# Patient Record
Sex: Female | Born: 1955 | Race: White | Hispanic: No | Marital: Married | Smoking: Current every day smoker
Health system: Southern US, Community
[De-identification: ages and names within clinical notes are randomized; demographics above are authoritative.]

## PROBLEM LIST (undated history)

## (undated) DIAGNOSIS — K219 Gastro-esophageal reflux disease without esophagitis: Secondary | ICD-10-CM

## (undated) DIAGNOSIS — F419 Anxiety disorder, unspecified: Secondary | ICD-10-CM

## (undated) HISTORY — PX: ABDOMINAL HYSTERECTOMY: SHX81

## (undated) HISTORY — DX: Anxiety disorder, unspecified: F41.9

## (undated) HISTORY — PX: DILATION AND CURETTAGE OF UTERUS: SHX78

## (undated) HISTORY — PX: TONSILLECTOMY: SUR1361

---

## 1976-06-06 HISTORY — PX: CERVICAL CONE BIOPSY: SUR198

## 2005-12-11 ENCOUNTER — Emergency Department (HOSPITAL_COMMUNITY): Admission: EM | Admit: 2005-12-11 | Discharge: 2005-12-11 | Payer: Self-pay | Admitting: Emergency Medicine

## 2012-12-12 ENCOUNTER — Other Ambulatory Visit: Payer: Self-pay | Admitting: Obstetrics and Gynecology

## 2012-12-12 DIAGNOSIS — N83202 Unspecified ovarian cyst, left side: Secondary | ICD-10-CM

## 2012-12-18 ENCOUNTER — Ambulatory Visit
Admission: RE | Admit: 2012-12-18 | Discharge: 2012-12-18 | Disposition: A | Discharge status: Home / Self Care | Payer: BC Managed Care – PPO | Source: Ambulatory Visit | Attending: Obstetrics and Gynecology | Admitting: Obstetrics and Gynecology

## 2012-12-18 DIAGNOSIS — N83202 Unspecified ovarian cyst, left side: Secondary | ICD-10-CM

## 2012-12-18 MED ORDER — IOHEXOL 300 MG/ML  SOLN
100.0000 mL | Freq: Once | INTRAMUSCULAR | Status: AC | PRN
  Administered 2012-12-18: 100 mL via INTRAVENOUS

## 2013-01-03 ENCOUNTER — Other Ambulatory Visit: Payer: Self-pay | Admitting: Gastroenterology

## 2013-01-03 DIAGNOSIS — R933 Abnormal findings on diagnostic imaging of other parts of digestive tract: Secondary | ICD-10-CM

## 2013-01-04 ENCOUNTER — Other Ambulatory Visit: Payer: BC Managed Care – PPO

## 2013-01-09 ENCOUNTER — Other Ambulatory Visit: Payer: Self-pay | Admitting: Gastroenterology

## 2013-01-09 DIAGNOSIS — R933 Abnormal findings on diagnostic imaging of other parts of digestive tract: Secondary | ICD-10-CM

## 2013-01-10 ENCOUNTER — Ambulatory Visit
Admission: RE | Admit: 2013-01-10 | Discharge: 2013-01-10 | Disposition: A | Discharge status: Home / Self Care | Payer: BC Managed Care – PPO | Source: Ambulatory Visit | Attending: Gastroenterology | Admitting: Gastroenterology

## 2013-01-10 DIAGNOSIS — R933 Abnormal findings on diagnostic imaging of other parts of digestive tract: Secondary | ICD-10-CM

## 2013-01-16 ENCOUNTER — Telehealth (INDEPENDENT_AMBULATORY_CARE_PROVIDER_SITE_OTHER): Payer: Self-pay

## 2013-01-16 NOTE — Telephone Encounter (Signed)
Pt returned my call . I r/s her appt from 8/25 to 9/19 with Dr Maisie Fus per Dr Doreen Salvage request.

## 2013-01-16 NOTE — Telephone Encounter (Signed)
LMOM for pt to call me b/c I need to r/s her appt from Dr Dwain Sarna to Dr Maisie Fus. Dr Dwain Sarna reviewed her records and advised pt to be seen by Dr Maisie Fus.

## 2013-01-29 ENCOUNTER — Ambulatory Visit (INDEPENDENT_AMBULATORY_CARE_PROVIDER_SITE_OTHER): Payer: Self-pay | Admitting: General Surgery

## 2013-02-06 ENCOUNTER — Encounter (INDEPENDENT_AMBULATORY_CARE_PROVIDER_SITE_OTHER): Payer: Self-pay | Admitting: General Surgery

## 2013-02-06 ENCOUNTER — Ambulatory Visit (INDEPENDENT_AMBULATORY_CARE_PROVIDER_SITE_OTHER): Payer: BC Managed Care – PPO | Admitting: General Surgery

## 2013-02-06 VITALS — BP 122/78 | HR 74 | Resp 16 | Ht 62.0 in | Wt 135.4 lb

## 2013-02-06 DIAGNOSIS — N739 Female pelvic inflammatory disease, unspecified: Secondary | ICD-10-CM

## 2013-02-06 DIAGNOSIS — N731 Chronic parametritis and pelvic cellulitis: Secondary | ICD-10-CM

## 2013-02-06 NOTE — Patient Instructions (Signed)
Diverticular Disease  Diverticulosis of the colon is a common condition that afflicts about 50 percent of Americans by age 57 and nearly all by age 2. Only a small percentage of those with diverticulosis have symptoms, and even fewer will ever require surgery.    What is Diverticulosis/ Diverticulitis?  Diverticula are pockets that develop in the colon wall, usually in the sigmoid or left colon, but may involve the entire colon. Diverticulosis describes the presence of these pockets. Diverticulitis describes inflammation or complications of these pockets.  What are the symptoms of diverticular disease?  Uncomplicated diverticular disease is usually not associated with symptoms. Symptoms are related to complications of diverticular disease including diverticulits and bleeding. Diverticular disease is a common cause of significant bleeding from the colon.  Diverticulitis - an infection of the diverticula - may cause one or more of the following symptoms: pain in the abdomen, chills, fever and change in bowel habits. More intense symptoms are associated with serious complications such as perforation (rupture), abscess or fistula formation (an abnormal connection between the colon and another organ or the skin). What is the cause of diverticular disease?  The cause of diverticulosis and diverticulitis is not precisely known, but it is more common for people with a low fiber diet. It is thought that a low-fiber diet over the years creates increased colon pressure and results in pockets or diverticula.  How is diverticular disease treated?  Increasing the amount of dietary fiber (grains, legumes, vegetables, etc.) - and sometimes restricting certain foods reduces the pressure in the colon and may decrease the risk of complications due to diverticular disease.  Diverticulitis requires different management. Mild cases may be managed with oral antibiotics, dietary restrictions and possibly stool softeners.  More severe cases require hospitalization with intravenous antibiotics and dietary restraints. Most acute attacks can be relieved with such methods.  When is surgery necessary?  Surgery is reserved for patients with recurrent episodes of diverticulitis, complications or severe attacks when there's little or no response to medication. Surgery may also be required in individuals with a single episode of severe bleeding from diverticulosis or with recurrent episodes of bleeding.  Surgical treatment for diverticulitis removes the diseased part of the colon, most commonly, the left or sigmoid colon. Often the colon is hooked up or "anastomosed" again to the rectum. Complete recovery can be expected. Normal bowel function usually resumes in about three weeks. In emergency surgeries, patients may require a temporary colostomy bag. Patients are encouraged to seek medical attention for abdominal symptoms early to help avoid complications.  What is a colon and rectal surgeon? Colon and rectal surgeons are experts in the surgical and non-surgical treatment of diseases of the colon, rectum and anus. They have completed advanced surgical training in the treatment of these diseases as well as full general surgical training. Board-certified colon and rectal surgeons complete residencies in general surgery and colon and rectal surgery, and pass intensive examinations conducted by the American Board of Surgery and the American Board of Colon and Rectal Surgery. They are well-versed in the treatment of both benign and malignant diseases of the colon, rectum and anus and are able to perform routine screening examinations and surgically treat conditions if indicated to do so.  2012 American Society of Colon & Rectal Surgeons

## 2013-02-06 NOTE — Progress Notes (Signed)
Chief Complaint  Patient presents with  . New Evaluation    eval abnormal CT scan    HISTORY: Jill Manning is a 57 y.o. 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  This all began in June when she developed severe sharp left lower quadrant pain. This radiated to her pelvis. After that she began to encounter vaginal discharge. On June 18 she experienced a large amount of purulent vaginal discharge. She was evaluated by her primary care doctor and placed on doxycycline. She is given a referral to gynecology with Dr. Cherly Hensen. She was placed on Premarin cream at that time. A CT scan was performed which showed an ovarian cyst and some stranding around her sigmoid colon with concern for a abscess. She underwent a colonoscopy with Dr. Loreta Ave. This was essentially normal except for some diverticulosis. She did notice that her colonic bowel prep seemed to be coming through her vagina as well.  She was placed on Cipro and Flagyl. She was able to take Flagyl for approximately 2 weeks but stopped this 2 to side effects. She is continuing on the Cipro currently for another 2 weeks. She does experience occasional vaginal drainage especially when she has diarrhea. She denies any bleeding rectally or vaginally. She does have ongoing lower back pain that appears to be unrelated to bowel movements. She has regular bowel movements without constipation except for on occasion. She currently is eating a high-fiber diet.    Past Medical History  Diagnosis Date  . Anxiety       Past Surgical History  Procedure Laterality Date  . Dilation and curettage of uterus    . Abdominal hysterectomy    . Cervical cone biopsy  1978      Current Outpatient Prescriptions  Medication Sig Dispense Refill  . ALPRAZolam (XANAX) 0.25 MG tablet       . ciprofloxacin (CIPRO) 500 MG tablet       . Hydrocodone-Acetaminophen 5-300 MG TABS       . nystatin-triamcinolone (MYCOLOG II) cream       .  ondansetron (ZOFRAN) 8 MG tablet       . PREMARIN vaginal cream       . metroNIDAZOLE (FLAGYL) 250 MG tablet        No current facility-administered medications for this visit.      Allergies  Allergen Reactions  . Flagyl [Metronidazole] Nausea Only      History reviewed. No pertinent family history.    History   Social History  . Marital Status: Married    Spouse Name: N/A    Number of Children: N/A  . Years of Education: N/A   Social History Main Topics  . Smoking status: Current Every Day Smoker -- 0.25 packs/day  . Smokeless tobacco: Never Used  . Alcohol Use: Yes  . Drug Use: No  . Sexual Activity: None   Other Topics Concern  . None   Social History Narrative  . None       REVIEW OF SYSTEMS - PERTINENT POSITIVES ONLY: Review of Systems - General ROS: negative for - chills, fatigue or fever Respiratory ROS: no cough, shortness of breath, or wheezing Cardiovascular ROS: no chest pain or dyspnea on exertion Gastrointestinal ROS: no abdominal pain, change in bowel habits, or black or bloody stools Genito-Urinary ROS: no dysuria, trouble voiding, or hematuria  EXAM: Filed Vitals:   02/06/13 1341  BP: 122/78  Pulse: 74  Resp: 16  Gen:  No acute distress.  Well nourished and well groomed.   Neurological: Alert and oriented to person, place, and time. Coordination normal.  Head: Normocephalic and atraumatic.  Eyes: Conjunctivae are normal. Pupils are equal, round, and reactive to light. No scleral icterus.  Neck: Normal range of motion. Neck supple. No tracheal deviation or thyromegaly present.  No cervical lymphadenopathy. Cardiovascular: Normal rate, regular rhythm, normal heart sounds  Respiratory: Effort normal.  No respiratory distress. No chest wall tenderness. Breath sounds normal.  No wheezes, rales or rhonchi.  GI: Soft. Bowel sounds are normal. The abdomen is soft and nontender.  There is no rebound and no guarding.  Musculoskeletal: Normal range  of motion. Extremities are nontender.  Skin: Skin is warm and dry. No rash noted. No diaphoresis. No erythema. No pallor. No clubbing, cyanosis, or edema.   Psychiatric: Normal mood and affect. Behavior is normal. Judgment and thought content normal.     LABORATORY RESULTS: Available labs are reviewed    RADIOLOGY RESULTS:   Images and reports are reviewed. CT ABD IMPRESSION:  Sigmoid diverticulosis with mild pericolonic fluid, possibly  reflecting sigmoid diverticulitis.  Adjacent 3.3 cm gas and fluid collection, suspicious for  pericolonic abscess or fistula.  Collection abuts the anterior vagina and posterior bladder dome,  but fistulous communication with either structure is not confirmed.  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 This is most likely the result of diverticulitis. I would like her to continue her Cipro for another 2 weeks. At that point we will get a CT scan. If she has a remaining abscess we will place a drain in interventional radiology. If there is no abscess and inflammation appears to be coming down, I recommend to proceed with sigmoidectomy approximately 3 months from the beginning of her treatment. This would be in mid November. In talking to the patient it sounds like Dr. Cherly Hensen would like to remove both of her ovaries. I think this could easily be performed at the same time. I will see her back in mid October to begin operative planning. I have recommended that she stay on a high fiber diet with plenty of fluids. She should try MiraLax for her occasional constipation. She should try to wean herself off of her narcotics, as well, to help promote good bowel motility.  She will try switching to Ibuprofen for help with her back pain.   Vanita Panda, MD Colon and Rectal Surgery / General Surgery [facility]      Visit Diagnoses: 1. Pelvic abscess in female     Primary Care Physician: No primary provider on file.

## 2013-02-07 ENCOUNTER — Telehealth (INDEPENDENT_AMBULATORY_CARE_PROVIDER_SITE_OTHER): Payer: Self-pay | Admitting: General Surgery

## 2013-02-07 NOTE — Telephone Encounter (Signed)
LMOM asking pt to return my call.  This is because I need to inform her that we have set her up for her CT scan at [facility] located at [address] on 02/12/13 at 2:45.  I need to explain that she cannot eat any solid foods 4 hours before the test and that she will drink her first bottle of contrast at 1:00 and the second bottle at 2:00.

## 2013-02-07 NOTE — Telephone Encounter (Signed)
Spoke with pt and was informed that the test needed to be done in 2 weeks. I rescheduled the appt for 02/25/13 with all the other directions the same including times.

## 2013-02-08 ENCOUNTER — Encounter (INDEPENDENT_AMBULATORY_CARE_PROVIDER_SITE_OTHER): Payer: Self-pay

## 2013-02-08 ENCOUNTER — Telehealth (INDEPENDENT_AMBULATORY_CARE_PROVIDER_SITE_OTHER): Payer: Self-pay | Admitting: General Surgery

## 2013-02-08 NOTE — Telephone Encounter (Signed)
Jill Manning called back and stated she had talked directly with pt just now, who stated she is controlling the pain with OTC meds and wants to "keep on the program" as discussed with Dr. Maisie Fus.  Her husband had called yesterday for the narcotic refill, but she does not think she needs it now.

## 2013-02-08 NOTE — Telephone Encounter (Signed)
Received call from [place] Ana, at [facility] with Dr. Cherly Hensen, regarding phone call from pt's husband to ask for additional pain medicine.  Dr. Cherly Hensen and Dr. Maisie Fus are coordinating surgery for a fistula.  Dr. Maisie Fus had recommended weaning off the narcotics, so Dr. Nelta Numbers office reluctant to refill Norco without checking with Dr. Maisie Fus first.  She cannot take NSAIDs (diverticulitis,) and is in a lot of pain.  Please advise.

## 2013-02-08 NOTE — Telephone Encounter (Signed)
Yes, I do not want to prescribe narcotics.  Her husband seems to be the only one asking for them.

## 2013-02-12 ENCOUNTER — Other Ambulatory Visit: Payer: BC Managed Care – PPO

## 2013-02-22 ENCOUNTER — Ambulatory Visit (INDEPENDENT_AMBULATORY_CARE_PROVIDER_SITE_OTHER): Payer: Self-pay | Admitting: General Surgery

## 2013-02-25 ENCOUNTER — Ambulatory Visit
Admission: RE | Admit: 2013-02-25 | Discharge: 2013-02-25 | Disposition: A | Discharge status: Home / Self Care | Payer: BC Managed Care – PPO | Source: Ambulatory Visit | Attending: General Surgery | Admitting: General Surgery

## 2013-02-25 DIAGNOSIS — N739 Female pelvic inflammatory disease, unspecified: Secondary | ICD-10-CM

## 2013-02-25 MED ORDER — IOHEXOL 300 MG/ML  SOLN
100.0000 mL | Freq: Once | INTRAMUSCULAR | Status: AC | PRN
  Administered 2013-02-25: 100 mL via INTRAVENOUS

## 2013-02-26 ENCOUNTER — Telehealth (INDEPENDENT_AMBULATORY_CARE_PROVIDER_SITE_OTHER): Payer: Self-pay

## 2013-02-26 NOTE — Telephone Encounter (Signed)
Message copied by Ivory Broad on Tue Feb 26, 2013  9:32 AM ------      Message from: Carson, Broadus John      Created: Mon Feb 25, 2013  4:52 PM      Regarding: CT results       At your convenience, please let pt know that her abscess has resolved and she appears to be healing.  I will see her back in mid oct.            AT      ----- Message -----         From: Rad Results In Interface         Sent: 02/25/2013   3:36 PM           To: Romie Levee, MD                   ------

## 2013-02-26 NOTE — Telephone Encounter (Signed)
Left message for pt to call and get her ct results below.  She has a follow up in October.

## 2013-02-26 NOTE — Telephone Encounter (Signed)
Patient return call to office.  Message given to patient that CT results show abscess has resolved and appears to be healing.  Patient advised to continue with follow up appointment in October.  Patient verbalized understanding and agrees with plan as noted.

## 2013-03-01 ENCOUNTER — Encounter (INDEPENDENT_AMBULATORY_CARE_PROVIDER_SITE_OTHER): Payer: Self-pay

## 2013-03-05 ENCOUNTER — Encounter (INDEPENDENT_AMBULATORY_CARE_PROVIDER_SITE_OTHER): Payer: Self-pay

## 2013-03-20 ENCOUNTER — Ambulatory Visit (INDEPENDENT_AMBULATORY_CARE_PROVIDER_SITE_OTHER): Payer: BC Managed Care – PPO | Admitting: General Surgery

## 2013-03-20 ENCOUNTER — Encounter (INDEPENDENT_AMBULATORY_CARE_PROVIDER_SITE_OTHER): Payer: Self-pay | Admitting: General Surgery

## 2013-03-20 VITALS — BP 142/88 | HR 74 | Temp 97.4°F | Resp 18 | Ht 62.0 in | Wt 136.6 lb

## 2013-03-20 DIAGNOSIS — K5732 Diverticulitis of large intestine without perforation or abscess without bleeding: Secondary | ICD-10-CM

## 2013-03-20 MED ORDER — METRONIDAZOLE 500 MG PO TABS: 500.0000 mg | ORAL_TABLET | ORAL | Status: AC

## 2013-03-20 NOTE — Progress Notes (Addendum)
Jill Manning is a 57 y.o. female who is here for a follow up visit regarding her diverticulitis and colovaginal fistula.  Her vaginal drainage has stopped.  She is feeling better.  She is eating a high fiber diet and having no issues with her bowel habits.   Previous history: Jill Manning is a 57 y.o. 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 This all began in June when she developed severe sharp left lower quadrant pain. This radiated to her pelvis. After that she began to encounter vaginal discharge. On June 18 she experienced a large amount of purulent vaginal discharge. She was evaluated by her primary care doctor and placed on doxycycline. She was given a referral to gynecology with Dr. Cherly Hensen. She was placed on Premarin cream at that time. A CT scan was performed which showed an ovarian cyst and some stranding around her sigmoid colon with concern for a abscess. She underwent a colonoscopy with Dr. Loreta Ave. This was essentially normal except for some diverticulosis. She did notice that her colonic bowel prep seemed to be coming through her vagina as well. She was placed on Cipro and Flagyl. She was able to take Flagyl for approximately 2 weeks but stopped this due to side effects. She continued on just Cipro for another 2 weeks. She does experience occasional vaginal drainage, especially when she has diarrhea. She denies any bleeding rectally or vaginally. She does have ongoing lower back pain that appears to be unrelated to bowel movements.    Objective: Filed Vitals:   03/20/13 1408  BP: 142/88  Pulse: 74  Temp: 97.4 F (36.3 C)  Resp: 18    General appearance: alert and cooperative GI: soft, non-tender; bowel sounds normal; no masses,  no organomegaly   Assessment and Plan: Jill Manning is a 57 y.o. F with complicated diverticulitis with abscess that appears to have drained vaginally.  She is doing better now.  She also has a dermoid cyst on  her ovary that the patient tells me Dr Cherly Hensen would like to remove.  I think it would be reasonable to do this together, if she is agreeable to this as well.  I hope to schedule this in mid to late Nov.   The surgery and anatomy were described to the patient as well as the risks of surgery and the possible complications.  These include: Bleeding, infection and possible wound complications such as hernia, damage to adjacent structures, leak of surgical connections, which can lead to other surgeries and possibly an ostomy (5-7%), possible need for other procedures, such as abscess drains in radiology, possible prolonged hospital stay, possible diarrhea from removal of part of the colon, possible constipation from narcotics, prolonged fatigue/weakness or appetite loss, possible complications of their medical problems such as heart disease or arrhythmias or lung problems, death (less than 1%). I believe the patient understands and wishes to proceed with the surgery.     Vanita Panda, MD [facility], [place] [phone]

## 2013-03-20 NOTE — Patient Instructions (Signed)
CENTRAL  SURGERY  ONE-DAY (1) PRE-OP HOME COLON PREP INSTRUCTIONS: ** MIRALAX / GATORADE PREP / FLAGYL**  You must follow the instructions below carefully.  If you have questions or problems, please call and speak to someone in the clinic department at our office:   [phone].     INSTRUCTIONS: 1. Five days prior to your procedure do not eat nuts, popcorn, or fruit with seeds.  Stop all fiber supplements such as Metamucil, Citrucel, etc. 2. Two days before surgery fill the prescription at a pharmacy of your choice and purchase the additional supplies below.         MIRALAX - GATORADE -- DULCOLAX TABS -- FLEET ENEMA:   Purchase a bottle of MIRALAX  (255 gm bottle)    In addition, purchase four (4) DULCOLAX TABLETS (no prescription required- ask the pharmacist if you can't find them)   Purchase one Fleet Enema (Green and white box)    Purchase one 64 oz GATORADE.  (Do NOT purchase red Gatorade; any other flavor is acceptable) and place in refrigerator to get cold.  3.   Day Before Surgery:   6 am: Wash you abdomen with soap and repeat this on the morning of surgery and take 4 Dulcolax tablets   You may only have clear liquids (tea, coffee, juice, broth, jello, soft drinks, gummy bears).  You cannot have solid foods, cream, milk or milk products.  Drink at lease 8 ounces of liquids every hour while awake.   Take the Flagyl prescription as directed at 8 am, 2 pm and 8 pm.  It is helpful to take this with some jello instead of on an empty stomach.  Any flavor is ok, except red jello, which will cause red stools.   Mix the entire bottle of MiraLax and the Gatorade in a large container.    10:00am: Begin drinking the Gatorade mixture until gone (8 oz every 15-30 minutes).      You may suck on a lime wedge or hard candy to "freshen your palate" in between glasses   If you are a diabetic, take your blood sugar reading several time throughout the prep.  Have some juice available to take if your  sugar level gets too low   You may feel chilled while taking the prep.  Have some warm tea or broth to help warm up.   Continue clear liquids until midnight or bedtime  3. The day of your procedure:   Do not eat or drink ANYTHING after midnight before your surgery.     If you take Heart or Blood Pressure medicine, ask the pre-op nurses about these during your preop appointment.   Take a regular Fleet Enema one hour before leaving the come to the hospital.  Try to hold it in 5-10 mins before expelling.   Further pre-operative instructions will be given to you from the hospital.   Expect to be contacted 5-7 days before your surgery.     

## 2013-06-11 ENCOUNTER — Other Ambulatory Visit: Payer: Self-pay | Admitting: Obstetrics and Gynecology

## 2013-06-14 ENCOUNTER — Encounter (HOSPITAL_COMMUNITY): Payer: Self-pay | Admitting: Pharmacy Technician

## 2013-06-19 ENCOUNTER — Encounter (HOSPITAL_COMMUNITY)
Admission: RE | Admit: 2013-06-19 | Discharge: 2013-06-19 | Disposition: A | Discharge status: Home / Self Care | Payer: BC Managed Care – PPO | Source: Ambulatory Visit | Attending: General Surgery | Admitting: General Surgery

## 2013-06-19 ENCOUNTER — Encounter (HOSPITAL_COMMUNITY): Payer: Self-pay

## 2013-06-19 DIAGNOSIS — Z01818 Encounter for other preprocedural examination: Secondary | ICD-10-CM | POA: Insufficient documentation

## 2013-06-19 DIAGNOSIS — Z01812 Encounter for preprocedural laboratory examination: Secondary | ICD-10-CM | POA: Insufficient documentation

## 2013-06-19 HISTORY — DX: Gastro-esophageal reflux disease without esophagitis: K21.9

## 2013-06-19 LAB — ABO/RH: ABO/RH(D): A POS

## 2013-06-19 LAB — CBC
HEMATOCRIT: 41.5 % (ref 36.0–46.0)
Hemoglobin: 14.1 g/dL (ref 12.0–15.0)
MCH: 31.8 pg (ref 26.0–34.0)
MCHC: 34 g/dL (ref 30.0–36.0)
MCV: 93.7 fL (ref 78.0–100.0)
PLATELETS: 351 10*3/uL (ref 150–400)
RBC: 4.43 MIL/uL (ref 3.87–5.11)
RDW: 13.2 % (ref 11.5–15.5)
WBC: 9.8 10*3/uL (ref 4.0–10.5)

## 2013-06-19 NOTE — Progress Notes (Signed)
[id number]  Dr. Marcello Moores No order entry in [facility] now -was there on 06-18-13, can you do MD order entry for your procedure please. Pt. Here now in PAT appointment. Have orders for Dr. Garwin Brothers procedure.

## 2013-06-19 NOTE — Progress Notes (Signed)
06-19-13 1530 Dr. Marcello Moores your MD orders dropped from Epic due to 90 day window outdated at midnight 06-18-13, no MD orders available for your procedure now. Need re-entry please. Thanks. W. Floy Sabina

## 2013-06-19 NOTE — Patient Instructions (Addendum)
[place]  06/19/2013   Your procedure is scheduled on:  06-25-2013  Report to [facility] at   [place]     AM   Call this number if you have problems the morning of surgery: [phone]  Or Presurgical Testing [phone](Tyla Burgner) For Living Will and/or Health Care Power Attorney Forms: please provide copy for your medical record,may bring AM of surgery(Forms should be already notarized -we do not provide this service).  Remember: Follow any bowel prep instructions per MD office.    Do not eat food:After Midnight.     Take these medicines the morning of surgery with A SIP OF WATER: Alprazolam. Ondansetron.   Do not wear jewelry, make-up or nail polish.  Do not wear lotions, powders, or perfumes. You may wear deodorant.  Do not shave 12 hours prior to first CHG shower(legs and under arms).(face and neck okay.)  Do not bring valuables to the hospital.(Hospital is not responsible for lost valuables).  Contacts, dentures or removable bridgework, body piercing, hair pins may not be worn into surgery.  Leave suitcase in the car. After surgery it may be brought to your room.  For patients admitted to the hospital, checkout time is 11:00 AM the day of discharge.(Restricted visitors-Persons, age 38 or younger - may not visit at this time.)    Patients discharged the day of surgery will not be allowed to drive home. Must have responsible person with you x 24 hours once discharged.  Name and phone number of your driver: Peterson Ao -spouse [phone] cell  Special Instructions: CHG(Chlorhedine 4%-"Hibiclens","Betasept","Aplicare") Shower Use Special Wash: see special instructions.(avoid face and genitals)   Please read over the following fact sheets that you were given:  Blood Transfusion fact sheet.  Remember : Type/Screen "Blue armbands" - may not be removed once applied(would result in being retested AM of surgery, if removed).  Failure to follow these instructions may  result in Cancellation of your surgery.   Patient signature_______________________________________________________

## 2013-06-20 ENCOUNTER — Other Ambulatory Visit (INDEPENDENT_AMBULATORY_CARE_PROVIDER_SITE_OTHER): Payer: Self-pay | Admitting: General Surgery

## 2013-06-24 MED ORDER — DEXTROSE 5 % IV SOLN
2.0000 g | INTRAVENOUS | Status: AC
  Administered 2013-06-25: 2 g via INTRAVENOUS
  Filled 2013-06-24: qty 2

## 2013-06-25 ENCOUNTER — Inpatient Hospital Stay (HOSPITAL_COMMUNITY)
Admission: RE | Admit: 2013-06-25 | Discharge: 2013-06-28 | DRG: 330 | Disposition: A | Discharge status: Home / Self Care | Payer: BC Managed Care – PPO | Source: Ambulatory Visit | Attending: General Surgery | Admitting: General Surgery

## 2013-06-25 ENCOUNTER — Encounter (HOSPITAL_COMMUNITY): Payer: Self-pay | Admitting: *Deleted

## 2013-06-25 ENCOUNTER — Encounter (HOSPITAL_COMMUNITY): Payer: BC Managed Care – PPO

## 2013-06-25 ENCOUNTER — Inpatient Hospital Stay (HOSPITAL_COMMUNITY): Payer: BC Managed Care – PPO | Admitting: Certified Registered Nurse Anesthetist

## 2013-06-25 ENCOUNTER — Encounter (HOSPITAL_COMMUNITY)
Admission: RE | Disposition: A | Discharge status: Home / Self Care | Payer: Self-pay | Source: Ambulatory Visit | Attending: General Surgery

## 2013-06-25 DIAGNOSIS — F411 Generalized anxiety disorder: Secondary | ICD-10-CM | POA: Diagnosis present

## 2013-06-25 DIAGNOSIS — F17200 Nicotine dependence, unspecified, uncomplicated: Secondary | ICD-10-CM | POA: Diagnosis present

## 2013-06-25 DIAGNOSIS — K5732 Diverticulitis of large intestine without perforation or abscess without bleeding: Principal | ICD-10-CM | POA: Diagnosis present

## 2013-06-25 DIAGNOSIS — K5669 Other intestinal obstruction: Secondary | ICD-10-CM | POA: Diagnosis present

## 2013-06-25 DIAGNOSIS — N824 Other female intestinal-genital tract fistulae: Secondary | ICD-10-CM | POA: Diagnosis present

## 2013-06-25 DIAGNOSIS — Z01812 Encounter for preprocedural laboratory examination: Secondary | ICD-10-CM

## 2013-06-25 DIAGNOSIS — D279 Benign neoplasm of unspecified ovary: Secondary | ICD-10-CM | POA: Diagnosis present

## 2013-06-25 DIAGNOSIS — K219 Gastro-esophageal reflux disease without esophagitis: Secondary | ICD-10-CM | POA: Diagnosis present

## 2013-06-25 HISTORY — PX: ROBOTIC ASSISTED BILATERAL SALPINGO OOPHERECTOMY: SHX6078

## 2013-06-25 LAB — TYPE AND SCREEN
ABO/RH(D): A POS
ANTIBODY SCREEN: NEGATIVE

## 2013-06-25 SURGERY — ROBOT ASSISTED LAPAROSCOPIC PARTIAL COLECTOMY
Anesthesia: General | Site: Abdomen

## 2013-06-25 MED ORDER — HYDROMORPHONE HCL PF 1 MG/ML IJ SOLN
INTRAMUSCULAR | Status: DC | PRN
  Administered 2013-06-25 (×4): 0.5 mg via INTRAVENOUS

## 2013-06-25 MED ORDER — ONDANSETRON HCL 4 MG/2ML IJ SOLN
INTRAMUSCULAR | Status: DC | PRN
  Administered 2013-06-25: 4 mg via INTRAVENOUS

## 2013-06-25 MED ORDER — ENOXAPARIN SODIUM 40 MG/0.4ML ~~LOC~~ SOLN
40.0000 mg | SUBCUTANEOUS | Status: DC
  Administered 2013-06-26 – 2013-06-28 (×3): 40 mg via SUBCUTANEOUS
  Filled 2013-06-25 (×5): qty 0.4

## 2013-06-25 MED ORDER — PROMETHAZINE HCL 25 MG/ML IJ SOLN
6.2500 mg | INTRAMUSCULAR | Status: DC | PRN
  Administered 2013-06-25: 12.5 mg via INTRAVENOUS

## 2013-06-25 MED ORDER — KETOROLAC TROMETHAMINE 15 MG/ML IJ SOLN: 15.0000 mg | Freq: Four times a day (QID) | INTRAMUSCULAR | Status: DC | PRN

## 2013-06-25 MED ORDER — ONDANSETRON HCL 4 MG/2ML IJ SOLN
INTRAMUSCULAR | Status: AC
  Filled 2013-06-25: qty 2

## 2013-06-25 MED ORDER — SCOPOLAMINE 1 MG/3DAYS TD PT72
MEDICATED_PATCH | TRANSDERMAL | Status: AC
  Filled 2013-06-25: qty 1

## 2013-06-25 MED ORDER — NEOSTIGMINE METHYLSULFATE 1 MG/ML IJ SOLN
INTRAMUSCULAR | Status: AC
  Filled 2013-06-25: qty 10

## 2013-06-25 MED ORDER — EPHEDRINE SULFATE 50 MG/ML IJ SOLN
INTRAMUSCULAR | Status: DC | PRN
  Administered 2013-06-25: 5 mg via INTRAVENOUS
  Administered 2013-06-25: 10 mg via INTRAVENOUS
  Administered 2013-06-25: 5 mg via INTRAVENOUS

## 2013-06-25 MED ORDER — ACETAMINOPHEN 500 MG PO TABS
1000.0000 mg | ORAL_TABLET | Freq: Four times a day (QID) | ORAL | Status: AC
  Administered 2013-06-25 – 2013-06-26 (×4): 1000 mg via ORAL
  Filled 2013-06-25 (×4): qty 2

## 2013-06-25 MED ORDER — ROCURONIUM BROMIDE 100 MG/10ML IV SOLN
INTRAVENOUS | Status: AC
  Filled 2013-06-25: qty 1

## 2013-06-25 MED ORDER — LACTATED RINGERS IV SOLN
INTRAVENOUS | Status: DC | PRN
  Administered 2013-06-25 (×4): via INTRAVENOUS

## 2013-06-25 MED ORDER — ROCURONIUM BROMIDE 100 MG/10ML IV SOLN
INTRAVENOUS | Status: DC | PRN
  Administered 2013-06-25: 50 mg via INTRAVENOUS
  Administered 2013-06-25: 20 mg via INTRAVENOUS
  Administered 2013-06-25 (×2): 10 mg via INTRAVENOUS
  Administered 2013-06-25 (×3): 20 mg via INTRAVENOUS
  Administered 2013-06-25: 10 mg via INTRAVENOUS

## 2013-06-25 MED ORDER — MIDAZOLAM HCL 5 MG/5ML IJ SOLN
INTRAMUSCULAR | Status: DC | PRN
  Administered 2013-06-25: 2 mg via INTRAVENOUS

## 2013-06-25 MED ORDER — LIDOCAINE HCL (CARDIAC) 20 MG/ML IV SOLN
INTRAVENOUS | Status: AC
  Filled 2013-06-25: qty 5

## 2013-06-25 MED ORDER — GLYCOPYRROLATE 0.2 MG/ML IJ SOLN
INTRAMUSCULAR | Status: DC | PRN
  Administered 2013-06-25: 0.6 mg via INTRAVENOUS

## 2013-06-25 MED ORDER — FENTANYL CITRATE 0.05 MG/ML IJ SOLN
INTRAMUSCULAR | Status: AC
  Filled 2013-06-25: qty 5

## 2013-06-25 MED ORDER — HYDROMORPHONE HCL PF 1 MG/ML IJ SOLN
INTRAMUSCULAR | Status: AC
  Filled 2013-06-25: qty 1

## 2013-06-25 MED ORDER — BUPIVACAINE-EPINEPHRINE PF 0.25-1:200000 % IJ SOLN
INTRAMUSCULAR | Status: AC
  Filled 2013-06-25: qty 30

## 2013-06-25 MED ORDER — GLYCOPYRROLATE 0.2 MG/ML IJ SOLN
INTRAMUSCULAR | Status: AC
  Filled 2013-06-25: qty 3

## 2013-06-25 MED ORDER — NEOSTIGMINE METHYLSULFATE 1 MG/ML IJ SOLN
INTRAMUSCULAR | Status: DC | PRN
  Administered 2013-06-25: 5 mg via INTRAVENOUS

## 2013-06-25 MED ORDER — STERILE WATER FOR IRRIGATION IR SOLN
Status: DC | PRN
  Administered 2013-06-25: 3000 mL

## 2013-06-25 MED ORDER — FENTANYL CITRATE 0.05 MG/ML IJ SOLN
INTRAMUSCULAR | Status: DC | PRN
  Administered 2013-06-25: 100 ug via INTRAVENOUS
  Administered 2013-06-25 (×2): 50 ug via INTRAVENOUS
  Administered 2013-06-25: 100 ug via INTRAVENOUS
  Administered 2013-06-25: 50 ug via INTRAVENOUS
  Administered 2013-06-25: 100 ug via INTRAVENOUS
  Administered 2013-06-25: 50 ug via INTRAVENOUS

## 2013-06-25 MED ORDER — LACTATED RINGERS IR SOLN
Status: DC | PRN
  Administered 2013-06-25: 1000 mL

## 2013-06-25 MED ORDER — LACTATED RINGERS IV SOLN
INTRAVENOUS | Status: DC | PRN
  Administered 2013-06-25: 08:00:00 via INTRAVENOUS

## 2013-06-25 MED ORDER — 0.9 % SODIUM CHLORIDE (POUR BTL) OPTIME
TOPICAL | Status: DC | PRN
  Administered 2013-06-25: 2000 mL

## 2013-06-25 MED ORDER — ALVIMOPAN 12 MG PO CAPS
12.0000 mg | ORAL_CAPSULE | Freq: Once | ORAL | Status: AC
  Administered 2013-06-25: 12 mg via ORAL
  Filled 2013-06-25: qty 1

## 2013-06-25 MED ORDER — BUPIVACAINE-EPINEPHRINE 0.25% -1:200000 IJ SOLN
INTRAMUSCULAR | Status: DC | PRN
  Administered 2013-06-25: 20 mL

## 2013-06-25 MED ORDER — HYDROMORPHONE HCL PF 1 MG/ML IJ SOLN: 0.2500 mg | INTRAMUSCULAR | Status: DC | PRN

## 2013-06-25 MED ORDER — DIPHENHYDRAMINE HCL 50 MG/ML IJ SOLN: 12.5000 mg | Freq: Four times a day (QID) | INTRAMUSCULAR | Status: DC | PRN

## 2013-06-25 MED ORDER — HETASTARCH-ELECTROLYTES 6 % IV SOLN
INTRAVENOUS | Status: DC | PRN
  Administered 2013-06-25: 10:00:00 via INTRAVENOUS

## 2013-06-25 MED ORDER — SODIUM CHLORIDE 0.9 % IJ SOLN
INTRAMUSCULAR | Status: AC
  Filled 2013-06-25: qty 10

## 2013-06-25 MED ORDER — ONDANSETRON HCL 4 MG/2ML IJ SOLN
4.0000 mg | Freq: Four times a day (QID) | INTRAMUSCULAR | Status: DC | PRN
  Administered 2013-06-25 – 2013-06-26 (×4): 4 mg via INTRAVENOUS
  Filled 2013-06-25 (×4): qty 2

## 2013-06-25 MED ORDER — PROMETHAZINE HCL 25 MG/ML IJ SOLN
INTRAMUSCULAR | Status: AC
  Filled 2013-06-25: qty 1

## 2013-06-25 MED ORDER — DEXAMETHASONE SODIUM PHOSPHATE 10 MG/ML IJ SOLN
INTRAMUSCULAR | Status: AC
Start: 2013-06-25 — End: 2013-06-25
  Filled 2013-06-25: qty 1

## 2013-06-25 MED ORDER — ALPRAZOLAM 0.25 MG PO TABS: 0.2500 mg | ORAL_TABLET | Freq: Two times a day (BID) | ORAL | Status: DC | PRN

## 2013-06-25 MED ORDER — ALVIMOPAN 12 MG PO CAPS
12.0000 mg | ORAL_CAPSULE | Freq: Two times a day (BID) | ORAL | Status: DC
  Administered 2013-06-26 (×2): 12 mg via ORAL
  Filled 2013-06-25 (×4): qty 1

## 2013-06-25 MED ORDER — PROPOFOL 10 MG/ML IV BOLUS
INTRAVENOUS | Status: AC
  Filled 2013-06-25: qty 20

## 2013-06-25 MED ORDER — PHENYLEPHRINE 40 MCG/ML (10ML) SYRINGE FOR IV PUSH (FOR BLOOD PRESSURE SUPPORT)
PREFILLED_SYRINGE | INTRAVENOUS | Status: AC
  Filled 2013-06-25: qty 10

## 2013-06-25 MED ORDER — SCOPOLAMINE 1 MG/3DAYS TD PT72
MEDICATED_PATCH | TRANSDERMAL | Status: DC | PRN
  Administered 2013-06-25: 1 via TRANSDERMAL

## 2013-06-25 MED ORDER — SUCCINYLCHOLINE CHLORIDE 20 MG/ML IJ SOLN
INTRAMUSCULAR | Status: DC | PRN
  Administered 2013-06-25: 100 mg via INTRAVENOUS

## 2013-06-25 MED ORDER — PROPOFOL 10 MG/ML IV BOLUS
INTRAVENOUS | Status: DC | PRN
  Administered 2013-06-25: 150 mg via INTRAVENOUS

## 2013-06-25 MED ORDER — KETOROLAC TROMETHAMINE 15 MG/ML IJ SOLN
15.0000 mg | Freq: Four times a day (QID) | INTRAMUSCULAR | Status: DC
  Administered 2013-06-25 – 2013-06-27 (×7): 15 mg via INTRAVENOUS
  Filled 2013-06-25 (×11): qty 1

## 2013-06-25 MED ORDER — SUCCINYLCHOLINE CHLORIDE 20 MG/ML IJ SOLN
INTRAMUSCULAR | Status: AC
  Filled 2013-06-25: qty 1

## 2013-06-25 MED ORDER — DEXTROSE 5 % IV SOLN
2.0000 g | Freq: Two times a day (BID) | INTRAVENOUS | Status: AC
  Administered 2013-06-25: 2 g via INTRAVENOUS
  Filled 2013-06-25 (×2): qty 2

## 2013-06-25 MED ORDER — ALUM & MAG HYDROXIDE-SIMETH 200-200-20 MG/5ML PO SUSP: 30.0000 mL | Freq: Four times a day (QID) | ORAL | Status: DC | PRN

## 2013-06-25 MED ORDER — MORPHINE SULFATE 2 MG/ML IJ SOLN
2.0000 mg | INTRAMUSCULAR | Status: DC | PRN
  Administered 2013-06-25 – 2013-06-26 (×4): 2 mg via INTRAVENOUS
  Filled 2013-06-25 (×4): qty 1

## 2013-06-25 MED ORDER — MIDAZOLAM HCL 2 MG/2ML IJ SOLN
INTRAMUSCULAR | Status: AC
  Filled 2013-06-25: qty 2

## 2013-06-25 MED ORDER — KCL IN DEXTROSE-NACL 20-5-0.45 MEQ/L-%-% IV SOLN
INTRAVENOUS | Status: DC
  Administered 2013-06-25 – 2013-06-26 (×2): via INTRAVENOUS
  Filled 2013-06-25 (×3): qty 1000

## 2013-06-25 MED ORDER — HYDROMORPHONE HCL PF 2 MG/ML IJ SOLN
INTRAMUSCULAR | Status: AC
  Filled 2013-06-25: qty 1

## 2013-06-25 MED ORDER — DEXAMETHASONE SODIUM PHOSPHATE 10 MG/ML IJ SOLN
INTRAMUSCULAR | Status: DC | PRN
  Administered 2013-06-25: 10 mg via INTRAVENOUS

## 2013-06-25 MED ORDER — PHENYLEPHRINE HCL 10 MG/ML IJ SOLN
INTRAMUSCULAR | Status: DC | PRN
  Administered 2013-06-25 (×5): 80 ug via INTRAVENOUS

## 2013-06-25 MED ORDER — HEPARIN SODIUM (PORCINE) 5000 UNIT/ML IJ SOLN
5000.0000 [IU] | Freq: Once | INTRAMUSCULAR | Status: AC
  Administered 2013-06-25: 5000 [IU] via SUBCUTANEOUS
  Filled 2013-06-25: qty 1

## 2013-06-25 MED ORDER — EPHEDRINE SULFATE 50 MG/ML IJ SOLN
INTRAMUSCULAR | Status: AC
  Filled 2013-06-25: qty 1

## 2013-06-25 MED ORDER — LACTATED RINGERS IV SOLN: INTRAVENOUS | Status: DC

## 2013-06-25 MED ORDER — ONDANSETRON HCL 4 MG PO TABS
4.0000 mg | ORAL_TABLET | Freq: Four times a day (QID) | ORAL | Status: DC | PRN
  Administered 2013-06-27 – 2013-06-28 (×3): 4 mg via ORAL
  Filled 2013-06-25 (×3): qty 1

## 2013-06-25 MED ORDER — CEFOTETAN DISODIUM-DEXTROSE 2-2.08 GM-% IV SOLR
INTRAVENOUS | Status: AC
  Filled 2013-06-25: qty 50

## 2013-06-25 MED ORDER — LIDOCAINE HCL (CARDIAC) 20 MG/ML IV SOLN
INTRAVENOUS | Status: DC | PRN
  Administered 2013-06-25: 100 mg via INTRAVENOUS

## 2013-06-25 MED ORDER — DIPHENHYDRAMINE HCL 12.5 MG/5ML PO ELIX: 12.5000 mg | ORAL_SOLUTION | Freq: Four times a day (QID) | ORAL | Status: DC | PRN

## 2013-06-25 SURGICAL SUPPLY — 100 items
APPLICATOR DUAL LIQUID (MISCELLANEOUS) ×3 IMPLANT
APPLIER CLIP 5 13 M/L LIGAMAX5 (MISCELLANEOUS)
APPLIER CLIP ROT 10 11.4 M/L (STAPLE)
BLADE EXTENDED COATED 6.5IN (ELECTRODE) ×3 IMPLANT
BLADE HEX COATED 2.75 (ELECTRODE) ×6 IMPLANT
BLADE SURG SZ10 CARB STEEL (BLADE) ×3 IMPLANT
CABLE HIGH FREQUENCY MONO STRZ (ELECTRODE) ×3 IMPLANT
CANISTER SUCTION 2500CC (MISCELLANEOUS) ×3 IMPLANT
CATH KIT ON Q 7.5IN SLV (PAIN MANAGEMENT) IMPLANT
CELLS DAT CNTRL 66122 CELL SVR (MISCELLANEOUS) ×1 IMPLANT
CLIP APPLIE 5 13 M/L LIGAMAX5 (MISCELLANEOUS) IMPLANT
CLIP APPLIE ROT 10 11.4 M/L (STAPLE) IMPLANT
CLIP LIGATING HEM O LOK PURPLE (MISCELLANEOUS) ×6 IMPLANT
CLIP LIGATING HEMO O LOK GREEN (MISCELLANEOUS) ×3 IMPLANT
CLIP LIGATING HEMOLOK MED (MISCELLANEOUS) IMPLANT
CORDS BIPOLAR (ELECTRODE) ×3 IMPLANT
COUNTER NEEDLE 20 DBL MAG RED (NEEDLE) ×3 IMPLANT
COVER MAYO STAND STRL (DRAPES) ×6 IMPLANT
COVER SURGICAL LIGHT HANDLE (MISCELLANEOUS) ×6 IMPLANT
COVER TIP SHEARS 8 DVNC (MISCELLANEOUS) ×1 IMPLANT
COVER TIP SHEARS 8MM DA VINCI (MISCELLANEOUS) ×2
DECANTER SPIKE VIAL GLASS SM (MISCELLANEOUS) IMPLANT
DRAIN CHANNEL 19F RND (DRAIN) IMPLANT
DRAPE LAPAROSCOPIC ABDOMINAL (DRAPES) ×3 IMPLANT
DRAPE LG THREE QUARTER DISP (DRAPES) ×15 IMPLANT
DRAPE TABLE BACK 44X90 PK DISP (DRAPES) ×6 IMPLANT
DRAPE UTILITY XL STRL (DRAPES) ×3 IMPLANT
DRAPE WARM FLUID 44X44 (DRAPE) ×6 IMPLANT
DRSG OPSITE POSTOP 4X10 (GAUZE/BANDAGES/DRESSINGS) IMPLANT
DRSG OPSITE POSTOP 4X6 (GAUZE/BANDAGES/DRESSINGS) ×3 IMPLANT
DRSG OPSITE POSTOP 4X8 (GAUZE/BANDAGES/DRESSINGS) IMPLANT
DRSG TEGADERM 2-3/8X2-3/4 SM (GAUZE/BANDAGES/DRESSINGS) IMPLANT
DRSG TEGADERM 4X4.75 (GAUZE/BANDAGES/DRESSINGS) IMPLANT
ELECT REM PT RETURN 9FT ADLT (ELECTROSURGICAL) ×3
ELECTRODE REM PT RTRN 9FT ADLT (ELECTROSURGICAL) ×1 IMPLANT
ENDOLOOP SUT PDS II  0 18 (SUTURE)
ENDOLOOP SUT PDS II 0 18 (SUTURE) IMPLANT
GLOVE BIO SURGEON STRL SZ 6.5 (GLOVE) ×6 IMPLANT
GLOVE BIO SURGEONS STRL SZ 6.5 (GLOVE) ×3
GLOVE BIOGEL PI IND STRL 7.0 (GLOVE) ×6 IMPLANT
GLOVE BIOGEL PI INDICATOR 7.0 (GLOVE) ×12
GLOVE ECLIPSE 6.5 STRL STRAW (GLOVE) ×6 IMPLANT
GLOVE ECLIPSE 7.0 STRL STRAW (GLOVE) ×18 IMPLANT
GLOVE ECLIPSE 8.0 STRL XLNG CF (GLOVE) ×12 IMPLANT
GLOVE INDICATOR 8.0 STRL GRN (GLOVE) ×6 IMPLANT
GOWN PREVENTION PLUS LG XLONG (DISPOSABLE) ×6 IMPLANT
GOWN STRL REUS W/TWL 2XL LVL3 (GOWN DISPOSABLE) ×6 IMPLANT
GOWN STRL REUS W/TWL XL LVL3 (GOWN DISPOSABLE) ×12 IMPLANT
KIT ACCESSORY DA VINCI DISP (KITS) ×2
KIT ACCESSORY DVNC DISP (KITS) ×1 IMPLANT
KIT BASIN OR (CUSTOM PROCEDURE TRAY) ×6 IMPLANT
KIT PROCEDURE DA VINCI SI (MISCELLANEOUS)
KIT PROCEDURE DVNC SI (MISCELLANEOUS) IMPLANT
LEGGING LITHOTOMY PAIR STRL (DRAPES) ×3 IMPLANT
PENCIL BUTTON HOLSTER BLD 10FT (ELECTRODE) ×6 IMPLANT
PUMP PAIN ON-Q (MISCELLANEOUS) IMPLANT
RTRCTR WOUND ALEXIS 18CM MED (MISCELLANEOUS) ×3
SCISSORS LAP 5X35 DISP (ENDOMECHANICALS) IMPLANT
SEALER TISSUE G2 STRG ARTC 35C (ENDOMECHANICALS) IMPLANT
SET IRRIG TUBING LAPAROSCOPIC (IRRIGATION / IRRIGATOR) ×3 IMPLANT
SLEEVE XCEL OPT CAN 5 100 (ENDOMECHANICALS) IMPLANT
SOLUTION ANTI FOG 6CC (MISCELLANEOUS) ×3 IMPLANT
SOLUTION ELECTROLUBE (MISCELLANEOUS) ×3 IMPLANT
SPONGE GAUZE 4X4 12PLY (GAUZE/BANDAGES/DRESSINGS) IMPLANT
SPONGE LAP 18X18 X RAY DECT (DISPOSABLE) ×6 IMPLANT
STAPLER CUT CVD 40MM BLUE (STAPLE) ×3 IMPLANT
STAPLER CUT RELOAD BLUE (STAPLE) ×3 IMPLANT
STAPLER VISISTAT 35W (STAPLE) IMPLANT
SUCTION POOLE TIP (SUCTIONS) ×3 IMPLANT
SUT MNCRL AB 4-0 PS2 18 (SUTURE) ×3 IMPLANT
SUT PDS AB 1 CTX 36 (SUTURE) IMPLANT
SUT PDS AB 1 TP1 96 (SUTURE) ×6 IMPLANT
SUT PROLENE 0 CT 2 (SUTURE) IMPLANT
SUT SILK 2 0 (SUTURE) ×2
SUT SILK 2 0 SH CR/8 (SUTURE) ×6 IMPLANT
SUT SILK 2-0 18XBRD TIE 12 (SUTURE) ×1 IMPLANT
SUT SILK 3 0 (SUTURE) ×2
SUT SILK 3 0 SH CR/8 (SUTURE) ×6 IMPLANT
SUT SILK 3-0 18XBRD TIE 12 (SUTURE) ×1 IMPLANT
SUT VIC AB 0 CT1 36 (SUTURE) ×9 IMPLANT
SUT VIC AB 2-0 SH 27 (SUTURE) ×2
SUT VIC AB 2-0 SH 27X BRD (SUTURE) ×1 IMPLANT
SUT VIC AB 4-0 PS2 27 (SUTURE) ×6 IMPLANT
SUT VICRYL 0 TIES 12 18 (SUTURE) ×3 IMPLANT
SUT VICRYL 0 UR6 27IN ABS (SUTURE) ×3 IMPLANT
SYR BULB IRRIGATION 50ML (SYRINGE) ×3 IMPLANT
SYS LAPSCP GELPORT 120MM (MISCELLANEOUS)
SYSTEM LAPSCP GELPORT 120MM (MISCELLANEOUS) IMPLANT
TAPE UMBILICAL COTTON 1/8X30 (MISCELLANEOUS) IMPLANT
TOWEL OR 17X26 10 PK STRL BLUE (TOWEL DISPOSABLE) ×6 IMPLANT
TOWEL OR NON WOVEN STRL DISP B (DISPOSABLE) ×6 IMPLANT
TRAY FOLEY CATH 14FRSI W/METER (CATHETERS) ×3 IMPLANT
TRAY LAP CHOLE (CUSTOM PROCEDURE TRAY) ×3 IMPLANT
TROCAR 12M 150ML BLUNT (TROCAR) ×3 IMPLANT
TROCAR BLADELESS OPT 5 100 (ENDOMECHANICALS) ×3 IMPLANT
TUBING CONNECTING 10 (TUBING) IMPLANT
TUBING CONNECTING 10' (TUBING)
TUBING FILTER THERMOFLATOR (ELECTROSURGICAL) ×3 IMPLANT
TUNNELER SHEATH ON-Q 16GX12 DP (PAIN MANAGEMENT) IMPLANT
YANKAUER SUCT BULB TIP 10FT TU (MISCELLANEOUS) ×6 IMPLANT

## 2013-06-25 NOTE — Transfer of Care (Signed)
Immediate Anesthesia Transfer of Care Note  Patient: Jill Manning  Procedure(s) Performed: Procedure(s) (LRB): ROBOTIC  ASSISTED SIGMOID COLECTOMY (N/A) OPEN BILATERAL SALPINGO OOPHORECTOMY (N/A)  Patient Location: PACU  Anesthesia Type: General  Level of Consciousness: sedated, patient cooperative and responds to stimulation  Airway & Oxygen Therapy: Patient Spontanous Breathing and Patient connected to face mask oxgen  Post-op Assessment: Report given to PACU RN and Post -op Vital signs reviewed and stable  Post vital signs: Reviewed and stable  Complications: No apparent anesthesia complications

## 2013-06-25 NOTE — H&P (Signed)
HPI: Jill Manning is a 58 y.o. femaleregarding her diverticulitis and colovaginal fistula. Her vaginal drainage has stopped. She is feeling better. She is eating a high fiber diet and having no issues with her bowel habits.  Previous history: Jill Manning is a 58 y.o. 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 This all began in June when she developed severe sharp left lower quadrant pain. This radiated to her pelvis. After that she began to encounter vaginal discharge. On June 18 she experienced a large amount of purulent vaginal discharge. She was evaluated by her primary care doctor and placed on doxycycline. She was given a referral to gynecology with Dr. Garwin Brothers. She was placed on Premarin cream at that time. A CT scan was performed which showed an ovarian cyst and some stranding around her sigmoid colon with concern for a abscess. She underwent a colonoscopy with Dr. Collene Mares. This was essentially normal except for some diverticulosis. She did notice that her colonic bowel prep seemed to be coming through her vagina as well. She was placed on Cipro and Flagyl. She was able to take Flagyl for approximately 2 weeks but stopped this due to side effects. She continued on just Cipro for another 2 weeks. She does experience occasional vaginal drainage, especially when she has diarrhea. She denies any bleeding rectally or vaginally. She does have ongoing lower back pain that appears to be unrelated to bowel movements.   Past Medical History  Diagnosis Date  . Anxiety   . GERD (gastroesophageal reflux disease)     occ. controls with Tums and diet   Past Surgical History  Procedure Laterality Date  . Dilation and curettage of uterus    . Cervical cone biopsy  1978  . Tonsillectomy      child  . Abdominal hysterectomy      "dysplasia"   History   Social History  . Marital Status: Married    Spouse Name: N/A    Number of Children: N/A  . Years of Education:  N/A   Occupational History  . Not on file.   Social History Main Topics  . Smoking status: Current Every Day Smoker -- 0.25 packs/day for 35 years  . Smokeless tobacco: Never Used     Comment: being more thoughtful, not smoking as much  . Alcohol Use: Yes     Comment: occ.  . Drug Use: No  . Sexual Activity: Yes   Other Topics Concern  . Not on file   Social History Narrative  . No narrative on file   History reviewed. No pertinent family history.   Medication List           ALPRAZolam 0.25 MG tablet  Commonly known as:  XANAX  Take 0.25 mg by mouth 2 (two) times daily as needed for anxiety.     calcium carbonate 500 MG chewable tablet  Commonly known as:  TUMS - dosed in mg elemental calcium  Chew 1 tablet by mouth daily.     ondansetron 8 MG tablet  Commonly known as:  ZOFRAN  Take by mouth every 8 (eight) hours as needed for nausea or vomiting. Nausea as needed     PREMARIN vaginal cream  Generic drug:  conjugated estrogens  Place 1 Applicatorful vaginally 2 (two) times a week.       Allergies  Allergen Reactions  . Flagyl [Metronidazole] Nausea Only    Objective:  Filed Vitals:   06/25/13 0240  BP: 116/80  Pulse:   Temp:   Resp:    General appearance: alert and cooperative  CV: RRR Lungs: CTA GI: soft, non-tender; bowel sounds normal; no masses, no organomegaly   Assessment and Plan:  Jill Manning is a 58 y.o. F with complicated diverticulitis with abscess that appears to have drained vaginally. She is doing better now. She also has a dermoid cyst on her ovary that Dr Garwin Brothers would like to remove. I think it iseasonable to do this together. The surgery and anatomy were described to the patient as well as the risks of surgery and the possible complications.  We discussed using robotic and laparoscopic assistance as needed.  Risks include: Bleeding, infection and possible wound complications such as hernia, damage to adjacent structures, leak of  surgical connections, which can lead to other surgeries and possibly an ostomy (5-7%), possible need for other procedures, such as abscess drains in radiology, possible prolonged hospital stay, possible diarrhea from removal of part of the colon, possible constipation from narcotics, prolonged fatigue/weakness or appetite loss, possible complications of their medical problems such as heart disease or arrhythmias or lung problems, death (less than 1%).  I believe the patient understands and wishes to proceed with the surgery.

## 2013-06-25 NOTE — Anesthesia Preprocedure Evaluation (Addendum)
Anesthesia Evaluation  Patient identified by MRN, date of birth, ID band Patient awake    Reviewed: Allergy & Precautions, H&P , NPO status , Patient's Chart, lab work & pertinent test results  Airway Mallampati: II TM Distance: >3 FB Neck ROM: Full    Dental  (+) Dental Advisory Given   Pulmonary Current Smoker,          Cardiovascular negative cardio ROS  Rhythm:Regular Rate:Normal     Neuro/Psych PSYCHIATRIC DISORDERS Anxiety negative neurological ROS     GI/Hepatic Neg liver ROS, GERD-  Medicated,  Endo/Other  negative endocrine ROS  Renal/GU negative Renal ROS     Musculoskeletal negative musculoskeletal ROS (+)   Abdominal   Peds  Hematology negative hematology ROS (+)   Anesthesia Other Findings   Reproductive/Obstetrics negative OB ROS                          Anesthesia Physical Anesthesia Plan  ASA: III  Anesthesia Plan: General   Post-op Pain Management:    Induction: Intravenous  Airway Management Planned: Oral ETT  Additional Equipment:   Intra-op Plan:   Post-operative Plan: Extubation in OR and Possible Post-op intubation/ventilation  Informed Consent: I have reviewed the patients History and Physical, chart, labs and discussed the procedure including the risks, benefits and alternatives for the proposed anesthesia with the patient or authorized representative who has indicated his/her understanding and acceptance.   Dental advisory given  Plan Discussed with: CRNA  Anesthesia Plan Comments:        Anesthesia Quick Evaluation

## 2013-06-25 NOTE — Preoperative (Signed)
Beta Blockers   Reason not to administer Beta Blockers:Not Applicable 

## 2013-06-25 NOTE — Op Note (Signed)
06/25/2013  2:32 PM  PATIENT:  Marchelle Gearing  58 y.o. female  Patient Care Team: Chesley Noon, MD as PCP - General (Family Medicine) Vicenta Aly as Nurse Practitioner (Nurse Practitioner)  PRE-OPERATIVE DIAGNOSIS:  diverticulitis/LEFT DERMOID CYST   POST-OPERATIVE DIAGNOSIS:  diverticulitis/LEFT DERMOID CYST   PROCEDURE:   ROBOTIC  ASSISTED SIGMOID COLECTOMY OPEN BILATERAL SALPINGO OOPHORECTOMY  SURGEON:  Surgeon(s): Adin Hector, MD Leighton Ruff, MD Marvene Staff, MD  ASSISTANT: Johney Maine   ANESTHESIA:   general  EBL:  Total I/O In: 7026 [I.V.:3000; IV Piggyback:500] Out: 225 [Urine:150; Blood:75]  DRAINS: none   SPECIMEN:  Source of Specimen:  recto-sigmoid colon  DISPOSITION OF SPECIMEN:  PATHOLOGY  COUNTS:  YES  PLAN OF CARE: Admit to inpatient   PATIENT DISPOSITION:  PACU - hemodynamically stable.  INDICATION: This is a 58 year old female who presented to me back in the summer with diverticulitis and a colovaginal fistula. She is also being followed by Dr. Garwin Brothers for a left dermoid cyst. We are here today to resect her sigmoid colon and remove her diverticular disease as well as the dermoid cyst.   OR FINDINGS: Relatively short segment of severe diverticular disease with stricture and colovaginal fistula, left dermoid cyst  DESCRIPTION: the patient was identified in the preoperative holding area and taken to the OR where they were laid Supine on the operating room table.  Gen. anesthesia was induced without difficulty. A Foley catheter was inserted under sterile conditions. The patient was placed in lithotomy position and appropriately padded and secured to the robotic table.  SCDs were also noted to be in place prior to the initiation of anesthesia.  The patient was then prepped and draped in the usual sterile fashion.    A surgical timeout was performed indicating the correct patient, procedure, positioning and need for preoperative  antibiotics.   I began by making a incision over the right lateral rectus muscle using a 15 blade scalpel. This was done above the umbilicus. I then bluntly dissected the subcutaneous tissues to the level of the fascia. The fascia was then transected using a Bovie electrocautery. The muscle fibers were then split and the peritoneum was elevated using Kelly clamps. The peritoneum was entered using Metzenbaum scissors. A stay suture was placed and a Hasson port was placed into the abdomen. The abdomen was insufflated to approximately 15 mm of mercury.  A robotic camera was placed into the abdomen. There were no signs of injury upon entry. An 8 mm trocar was placed in the left upper cautery under direct visualization. A second trocar was placed in the left lateral position.  A third robotic trocar was placed in the right lower quadrant also under direct visualization. A 5 mm assist port was placed in the right upper quadrant. After this was completed the ports were appropriately separated. The patient was then placed in Trendelenburg position and tilted slightly to the right. The small bowel was mobilized out of the pelvis as well as the omentum.  I then docked the robot over the patient's left hip.  All ports were secured. The camera was then placed in the abdomen. I then switched over to the robotic portion. I mobilized the mesentery of the sigmoid colon. I dissected out medial to lateral. Hemostasis was achieved with electric artery as I went. I identified the left ureter in its standard position.  The right ureter was easily visualized as well. I then dissected out the sigmoid artery and hemorrhoidal artery.  Clips were placed on both robotically. I then transected both arteries. The remaining mesentery was divided using electrocautery. I then mobilized the colon off of the lateral sidewall using a combination of blunt and sharp dissection. After this was completed we're able to mobilize the colon out of the  pelvis. I continued my dissection laterally on both sides down to the level of the rectosigmoid junction. I identified the rectosigmoid junction and made a division in the mesentery using electrocautery. Hemostasis was achieved as we went. Once all the mesentery was divided and the colon was properly mobilized, I turned my attention to the left sidewall. The white line of Toldt was mobilized proximally halfway up the abdomen. This helped the sigmoid to be more medial. I could easily get the sigmoid to reach down into the pelvis for an anastomosis and did not need to mobilize the splenic flexure. We then removed the robot and made a 6 cm Pfannenstiel incision using a scalpel. Dissection was carried down through subcutaneous tissues using blunt dissection and Bovie electric cautery. The fascia was incised in a horizontal manner using Bovie electrocautery. We then elevated the fascia from the muscle layer he and transected the peritoneum and midline using Bovie electric artery. After this was completed an alexis would protector was placed. The colon was mobilized out of the wound.  I transected the proximal colon using a contour stapling device. I then transected the rectosigmoid junction also using the contour stapling device with a blue load staples.  The specimen was then removed from the abdomen and sent to pathology for further examination. I then removed the staple line from the proximal colon using Bovie electric cautery. A pursestring device was used to create a pursestring suture using 2-0 Prolene. A 29 mm EEA anvil was then placed into the proximal colon and the purse stringer was tied tightly around this. After this was completed a 29 mm EEA stapler was placed through the rectum and out the rectal stump. The anastomosis was created without difficulty. The 2 doughnuts were intact. The rectum was insufflated and no air leaks were identified under water. The anastomosis was approximately 16 cm. The colon was  then desufflated and we turned the case over to the GYN team for removal of ovaries. This will be dictated separately. After they completed their procedure the pelvis was irrigated. A portion of the staple line was oversewn using 3-0 silk sutures. The omentum was brought down into the pelvis. The Periumbilical fascia of the camera port was closed using a 0 Vicryl suture.  I then closed the peritoneum of the Pfannenstiel incision using a running 2-0 Vicryl suture. The fascia was then closed using 2, 0 loop PDS sutures.  The subcutaneous tissue was reapproximated using 2-0 Vicryl sutures. The skin of all incisions was closed using 4-0 Vicryl running sutures.  Dermabond was placed on the port sites and a sterile dressing was placed on the Pfannenstiel incision. The patient was then awakened from anesthesia and sent to the post anesthesia care unit in stable condition. All counts were correct per operating room staff.

## 2013-06-25 NOTE — Brief Op Note (Signed)
06/25/2013  1:42 PM  PATIENT:  Jill Manning  58 y.o. female  PRE-OPERATIVE DIAGNOSIS:  diverticulitis/LEFT DERMOID CYST   POST-OPERATIVE DIAGNOSIS:  diverticulitis/LEFT DERMOID CYST   PROCEDURE:  Procedure(s): ROBOTIC  ASSISTED SIGMOID COLECTOMY (N/A) OPEN BILATERAL SALPINGO OOPHORECTOMY (N/A)  SURGEON:  Surgeon(s) and Role: Panel 1:    * Adin Hector, MD - Assisting    * Leighton Ruff, MD - Primary  Panel 2:    * Marvene Staff, MD - Primary  PHYSICIAN ASSISTANT:   ASSISTANTS: Laury Deep, CNM   ANESTHESIA:   general  EBL:  Total I/O In: 2500 [I.V.:2000; IV Piggyback:500] Out: 225 [Urine:150; Blood:75]  Findings: 5 cm left ovarian dermoid, nl tubes and right ovary, right ureter seen peristalsis, left ureter seen, surgical absent uterus, thickened vaginal cuff  BLOOD ADMINISTERED:none  DRAINS: none   LOCAL MEDICATIONS USED:  NONE  SPECIMEN:  Source of Specimen:  right tube and ovary, left tube with left ovarian dermoid  DISPOSITION OF SPECIMEN:  PATHOLOGY  COUNTS:  YES  TOURNIQUET:  * No tourniquets in log *  DICTATION: .Other Dictation: Dictation Number [id number]  PLAN OF CARE: Admit to inpatient   PATIENT DISPOSITION:  PACU - hemodynamically stable.   Delay start of Pharmacological VTE agent (>24hrs) due to surgical blood loss or risk of bleeding: no

## 2013-06-26 ENCOUNTER — Encounter (HOSPITAL_COMMUNITY): Payer: Self-pay | Admitting: General Surgery

## 2013-06-26 LAB — BASIC METABOLIC PANEL
BUN: 5 mg/dL — AB (ref 6–23)
CO2: 26 mEq/L (ref 19–32)
CREATININE: 0.55 mg/dL (ref 0.50–1.10)
Calcium: 7.9 mg/dL — ABNORMAL LOW (ref 8.4–10.5)
Chloride: 101 mEq/L (ref 96–112)
GFR calc Af Amer: >90 mL/min (ref >90)
GLUCOSE: 125 mg/dL — AB (ref 70–99)
POTASSIUM: 3.7 meq/L (ref 3.7–5.3)
Sodium: 138 mEq/L (ref 137–147)

## 2013-06-26 LAB — CBC
HCT: 34.3 % — ABNORMAL LOW (ref 36.0–46.0)
HEMOGLOBIN: 11.7 g/dL — AB (ref 12.0–15.0)
MCH: 31.9 pg (ref 26.0–34.0)
MCHC: 34.1 g/dL (ref 30.0–36.0)
MCV: 93.5 fL (ref 78.0–100.0)
Platelets: 279 10*3/uL (ref 150–400)
RBC: 3.67 MIL/uL — ABNORMAL LOW (ref 3.87–5.11)
RDW: 12.8 % (ref 11.5–15.5)
WBC: 14.4 10*3/uL — ABNORMAL HIGH (ref 4.0–10.5)

## 2013-06-26 MED ORDER — PROMETHAZINE HCL 25 MG/ML IJ SOLN
12.5000 mg | INTRAMUSCULAR | Status: DC | PRN
  Administered 2013-06-26: 12.5 mg via INTRAVENOUS
  Filled 2013-06-26: qty 1

## 2013-06-26 NOTE — Progress Notes (Signed)
Patient c/o nausea. Dose of zofran given IV at 0812 short-acting. No vomitus. Dr. Marcello Moores notified with t.o.v. For npo and pheneragan 12.5mg  IV every 4hours prn nausea.

## 2013-06-26 NOTE — Care Management Note (Signed)
    Page 1 of 1   06/26/2013     9:14:33 AM   CARE MANAGEMENT NOTE 06/26/2013  Patient:  Jill Manning, Jill Manning   Account Number:  [id number]  Date Initiated:  06/26/2013  Documentation initiated by:  Sunday Spillers  Subjective/Objective Assessment:   58 yo female admitted s/p Laporotomy, colectomy, BSO. PTA lived at home with spouse.     Action/Plan:   Home when stable   Anticipated DC Date:  06/29/2013   Anticipated DC Plan:  [place]  CM consult      Choice offered to / List presented to:             Status of service:  Completed, signed off Medicare Important Message given?   (If response is "NO", the following Medicare IM given date fields will be blank) Date Medicare IM given:   Date Additional Medicare IM given:    Discharge Disposition:  HOME/SELF CARE  Per UR Regulation:  Reviewed for med. necessity/level of care/duration of stay  If discussed at [place] of Stay Meetings, dates discussed:    Comments:

## 2013-06-26 NOTE — Progress Notes (Signed)
1 Day Post-Op robotic sigmoidectomy and bilateral oophorectomy  Subjective: Having min nausea, pain better controlled this am.    Objective: Vital signs in last 24 hours: Temp:  [97.3 F (36.3 C)-99.1 F (37.3 C)] 98.9 F (37.2 C) (01/21 0555) Pulse Rate:  [74-113] 88 (01/21 0555) Resp:  [11-16] 16 (01/21 0555) BP: (114-158)/(70-97) 114/70 mmHg (01/21 0555) SpO2:  [93 %-100 %] 97 % (01/21 0555) Weight:  [138 lb (62.596 kg)] 138 lb (62.596 kg) (01/20 1545)   Intake/Output from previous day: 01/20 0701 - 01/21 0700 In: 5593.3 [P.O.:300; I.V.:4743.3; IV Piggyback:550] Out: 1150 [Urine:1075; Blood:75] Intake/Output this shift:     General appearance: alert and cooperative Resp: no resp distress Cardio: regular rate and rhythm GI: normal findings: soft, non-tender  Incision: slight bloody drainage on dressing  Lab Results:   Recent Labs  06/26/13 0538  WBC 14.4*  HGB 11.7*  HCT 34.3*  PLT 279   BMET  Recent Labs  06/26/13 0538  NA 138  K 3.7  CL 101  CO2 26  GLUCOSE 125*  BUN 5*  CREATININE 0.55  CALCIUM 7.9*   PT/INR No results found for this basename: LABPROT, INR,  in the last 72 hours ABG No results found for this basename: PHART, PCO2, PO2, HCO3,  in the last 72 hours  MEDS, Scheduled . acetaminophen  1,000 mg Oral Q6H  . alvimopan  12 mg Oral BID  . enoxaparin (LOVENOX) injection  40 mg Subcutaneous Q24H  . ketorolac  15 mg Intravenous Q6H    Studies/Results: No results found.  Assessment: s/p Procedure(s): ROBOTIC  ASSISTED SIGMOID COLECTOMY OPEN BILATERAL SALPINGO OOPHORECTOMY Patient Active Problem List   Diagnosis Date Noted  . Colovaginal fistula 06/25/2013  . Diverticulitis of colon (without mention of hemorrhage) 03/20/2013  . Pelvic abscess in female 02/06/2013    Expected post op course  Plan: d/c foley Cont clears Ambulate   LOS: 1 day     .Rosario Adie, [facility],  [place]   06/26/2013 9:13 AM

## 2013-06-26 NOTE — Op Note (Signed)
Jill Manning, Jill Manning             ACCOUNT NO.:  [id number]  MEDICAL RECORD NO.:  [id number]  LOCATION:  6063                         FACILITY:  [facility]  PHYSICIAN:  Servando Salina, M.D.DATE OF BIRTH:  04/05/1956  DATE OF PROCEDURE:  06/25/2013 DATE OF DISCHARGE:                              OPERATIVE REPORT   PREOPERATIVE DIAGNOSES:  Left ovarian dermoid cyst, colovaginal fistula.  PROCEDURE:  Open laparotomy, bilateral salpingo-oophorectomy.  POSTOPERATIVE DIAGNOSES:  Left ovarian dermoid cyst, postmenopausal, colovaginal fistula.  ANESTHESIA:  General.  SURGEON:  Servando Salina, MD.  ASSISTANTLaury Deep, CNM.  PROCEDURE:  The patient was already in the dorsal lithotomy position. She had already undergone a [employer] robotic sigmoid colectomy by Dr. Marcello Moores and Dr. Johney Maine.  The patient had a Pfannenstiel incision at the time of my arrival with a gel port in place. That site had been used to extract her colectomy specimen.  The reanastomosis had already been done.  The pelvis was well visualized as evidenced by the left ovary being seen easily. Decision was then made to proceed with the already open incision.  I scrubbed in and with my own assist, the pelvis was inspected.   The tube was noted on the right.  Normal atrophic ovary was noted on the right.  The patient had a prior hysterectomy.  The vaginal cuff had area of induration/thickening from her chronic bowel infection.  The left ovary had a 5 cm adnexal mass previously recognized as a dermoid by radiological study.  The fallopian tube on the left was elongated but otherwise unremarkable.  There was adhesion of the round ligament to the left tube and ovary noted.  The procedure was started on the right side where the retroperitoneal space was opened on the right, dissected down, the ureter was seen.  The window was made in the peritoneum above the ureter and the proximal portion of the IP ligament was  free-tied with 0 Vicryl suture x2 proximally and distally x1 with the intervening segment being cut.  The round ligament was identified, ligated, and cut and the distal portion of the right tube and ovary was then cauterized with removal of both tubes and ovaries on the right.  Attention was then turned to the left side.  The anastomosis was noted.  There was always  dissection done below that.  There was more thickening on that side.  The distal portion has some attachment to the vaginal cuff.  This was lysed with blunt and sharp dissection.  The distal portion was clamped, cut, and suture ligated with 0 Vicryl suture.  The retroperitoneal space was opened on the left.  It was a little more difficult to see the ureter initially until we traced up on the proximal portion on the left side.  Once this was clear, the clear space in the medial leaf of the peritoneum was then opened.  The proximal portion of the IP ligament was then clamped x2, cut, free-tied with 0 Vicryl and suture ligated with 0 Vicryl suture.  The left ovarian cyst and ovary with tube were then removed.  The pelvis was irrigated and hemostasis achieved.  The pelvis was once again inspected, and at  that point Dr. Marcello Moores was called back into the surgery to complete the closure of the incisions made by her from the earlier portion of the case.  SPECIMEN: left tube and ovary with the cyst.  Right tube and ovary sent to Pathology.  ESTIMATED BLOOD LOSS:  Probably less than 30 mL.  The patient was then turned back over to Dr. Marcello Moores as stated previously.     Servando Salina, M.D.     [place]/MEDQ  D:  06/25/2013  T:  06/26/2013  Job:  [id number]

## 2013-06-26 NOTE — Anesthesia Postprocedure Evaluation (Signed)
  Anesthesia Post-op Note  Patient: Jill Manning  Procedure(s) Performed: Procedure(s) (LRB): ROBOTIC  ASSISTED SIGMOID COLECTOMY (N/A) OPEN BILATERAL SALPINGO OOPHORECTOMY (N/A)  Patient Location: PACU  Anesthesia Type: General  Level of Consciousness: awake and alert   Airway and Oxygen Therapy: Patient Spontanous Breathing  Post-op Pain: mild  Post-op Assessment: Post-op Vital signs reviewed, Patient's Cardiovascular Status Stable, Respiratory Function Stable, Patent Airway and No signs of Nausea or vomiting  Last Vitals:  Filed Vitals:   06/26/13 0555  BP: 114/70  Pulse: 88  Temp: 37.2 C  Resp: 16    Post-op Vital Signs: stable   Complications: No apparent anesthesia complications

## 2013-06-27 LAB — CBC
HCT: 32.7 % — ABNORMAL LOW (ref 36.0–46.0)
Hemoglobin: 10.7 g/dL — ABNORMAL LOW (ref 12.0–15.0)
MCH: 31.1 pg (ref 26.0–34.0)
MCHC: 32.7 g/dL (ref 30.0–36.0)
MCV: 95.1 fL (ref 78.0–100.0)
PLATELETS: 242 10*3/uL (ref 150–400)
RBC: 3.44 MIL/uL — ABNORMAL LOW (ref 3.87–5.11)
RDW: 13 % (ref 11.5–15.5)
WBC: 10.8 10*3/uL — ABNORMAL HIGH (ref 4.0–10.5)

## 2013-06-27 LAB — BASIC METABOLIC PANEL
BUN: 7 mg/dL (ref 6–23)
CALCIUM: 7.9 mg/dL — AB (ref 8.4–10.5)
CO2: 27 mEq/L (ref 19–32)
Chloride: 103 mEq/L (ref 96–112)
Creatinine, Ser: 0.58 mg/dL (ref 0.50–1.10)
GFR calc Af Amer: >90 mL/min (ref >90)
GLUCOSE: 106 mg/dL — AB (ref 70–99)
Potassium: 3.9 mEq/L (ref 3.7–5.3)
SODIUM: 139 meq/L (ref 137–147)

## 2013-06-27 MED ORDER — SODIUM CHLORIDE 0.9 % IJ SOLN
3.0000 mL | Freq: Two times a day (BID) | INTRAMUSCULAR | Status: DC
  Administered 2013-06-27: 3 mL via INTRAVENOUS

## 2013-06-27 MED ORDER — SODIUM CHLORIDE 0.9 % IJ SOLN: 3.0000 mL | INTRAMUSCULAR | Status: DC | PRN

## 2013-06-27 MED ORDER — HYDROCODONE-ACETAMINOPHEN 5-325 MG PO TABS
1.0000 | ORAL_TABLET | ORAL | Status: DC | PRN
  Administered 2013-06-27 (×3): 2 via ORAL
  Administered 2013-06-28 (×3): 1 via ORAL
  Filled 2013-06-27 (×2): qty 1
  Filled 2013-06-27: qty 2
  Filled 2013-06-27: qty 1
  Filled 2013-06-27 (×2): qty 2

## 2013-06-27 NOTE — Progress Notes (Signed)
Dr. Marcello Moores aware via phone pt lost 2nd IV access around 1230. Tolerating diet as well as prescribed po pain medication. See new orders received.

## 2013-06-27 NOTE — Progress Notes (Signed)
2 Days Post-Op robotic sigmoidectomy and bilateral oophorectomy  Subjective: Nausea better, having flatus and a small BM last night  Objective: Vital signs in last 24 hours: Temp:  [98.7 F (37.1 C)-100.6 F (38.1 C)] 99.1 F (37.3 C) (01/22 0600) Pulse Rate:  [84-89] 84 (01/22 0600) Resp:  [16-18] 16 (01/22 0600) BP: (120-125)/(82-86) 124/82 mmHg (01/22 0600) SpO2:  [93 %-96 %] 95 % (01/22 0600)   Intake/Output from previous day: 01/21 0701 - 01/22 0700 In: 1600 [I.V.:1600] Out: 1975 [Urine:1975] Intake/Output this shift:     General appearance: alert and cooperative Resp: no resp distress Cardio: regular rate and rhythm GI: normal findings: soft, non-tender  Incision: slight bloody drainage on dressing  Lab Results:   Recent Labs  06/26/13 0538 06/27/13 0555  WBC 14.4* 10.8*  HGB 11.7* 10.7*  HCT 34.3* 32.7*  PLT 279 242   BMET  Recent Labs  06/26/13 0538 06/27/13 0555  NA 138 139  K 3.7 3.9  CL 101 103  CO2 26 27  GLUCOSE 125* 106*  BUN 5* 7  CREATININE 0.55 0.58  CALCIUM 7.9* 7.9*   PT/INR No results found for this basename: LABPROT, INR,  in the last 72 hours ABG No results found for this basename: PHART, PCO2, PO2, HCO3,  in the last 72 hours  MEDS, Scheduled . enoxaparin (LOVENOX) injection  40 mg Subcutaneous Q24H  . ketorolac  15 mg Intravenous Q6H  . sodium chloride  3 mL Intravenous Q12H    Studies/Results: No results found.  Assessment: s/p Procedure(s): ROBOTIC  ASSISTED SIGMOID COLECTOMY OPEN BILATERAL SALPINGO OOPHORECTOMY Patient Active Problem List   Diagnosis Date Noted  . Colovaginal fistula 06/25/2013  . Diverticulitis of colon (without mention of hemorrhage) 03/20/2013  . Pelvic abscess in female 02/06/2013    Expected post op course  Plan:  Soft diet SL IV Ambulate Possible d/c tom   LOS: 2 days     .Rosario Adie, [facility], [place]   06/27/2013 7:37 AM

## 2013-06-27 NOTE — Progress Notes (Signed)
POD #2 S/P BSO, robotic sigmoid colectomy  S: feels well tol reg diet (+) Flatus (+) BM  O: VS 1/22 Tmax 99.1  BP 124/82  ABD soft non distended Incisions: pfannenstiel  With dressing in place Other port sites well approximated Pad none Ext: (-) edema  Path c/w left teratoma, benign right tube and ovary  1/22 Hgb 10.7  Wbc 10.8  IMP: S/P BSO, sigmoid colectomy doing very well P) F/u  Postop gyn 4 weeks  resume estrace cream per vagina 2x/ week starting next week( pt has enough)  Postop instructions will be based on Dr Marcello Moores recommendations

## 2013-06-28 ENCOUNTER — Telehealth (INDEPENDENT_AMBULATORY_CARE_PROVIDER_SITE_OTHER): Payer: Self-pay

## 2013-06-28 LAB — CBC
HCT: 32.9 % — ABNORMAL LOW (ref 36.0–46.0)
Hemoglobin: 11 g/dL — ABNORMAL LOW (ref 12.0–15.0)
MCH: 31.6 pg (ref 26.0–34.0)
MCHC: 33.4 g/dL (ref 30.0–36.0)
MCV: 94.5 fL (ref 78.0–100.0)
PLATELETS: 242 10*3/uL (ref 150–400)
RBC: 3.48 MIL/uL — ABNORMAL LOW (ref 3.87–5.11)
RDW: 12.9 % (ref 11.5–15.5)
WBC: 8.5 10*3/uL (ref 4.0–10.5)

## 2013-06-28 LAB — BASIC METABOLIC PANEL
BUN: 7 mg/dL (ref 6–23)
CALCIUM: 8.2 mg/dL — AB (ref 8.4–10.5)
CO2: 28 mEq/L (ref 19–32)
Chloride: 101 mEq/L (ref 96–112)
Creatinine, Ser: 0.61 mg/dL (ref 0.50–1.10)
GFR calc non Af Amer: >90 mL/min (ref >90)
Glucose, Bld: 95 mg/dL (ref 70–99)
Potassium: 3.8 mEq/L (ref 3.7–5.3)
Sodium: 139 mEq/L (ref 137–147)

## 2013-06-28 MED ORDER — HYDROCODONE-ACETAMINOPHEN 5-325 MG PO TABS: 1.0000 | ORAL_TABLET | ORAL | Status: DC | PRN

## 2013-06-28 NOTE — Discharge Instructions (Signed)
ABDOMINAL SURGERY: POST OP INSTRUCTIONS  1. DIET: Follow a light bland diet the first 24 hours after arrival home, such as soup, liquids, crackers, etc.  Be sure to include lots of fluids daily.  Avoid fast food or heavy meals as your are more likely to get nauseated.  Do not eat any uncooked fruits or vegetables for the next 2 weeks as your colon heals. 2. Take your usually prescribed home medications unless otherwise directed. 3. PAIN CONTROL: a. Pain is best controlled by a usual combination of three different methods TOGETHER: i. Ice/Heat ii. Over the counter pain medication iii. Prescription pain medication b. Most patients will experience some swelling and bruising around the incisions.  Ice packs or heating pads (30-60 minutes up to 6 times a day) will help. Use ice for the first few days to help decrease swelling and bruising, then switch to heat to help relax tight/sore spots and speed recovery.  Some people prefer to use ice alone, heat alone, alternating between ice & heat.  Experiment to what works for you.  Swelling and bruising can take several weeks to resolve.   c. It is helpful to take an over-the-counter pain medication regularly for the first few weeks.  Choose one of the following that works best for you: i. Naproxen (Aleve, etc)  Two 220mg  tabs twice a day ii. Ibuprofen (Advil, etc) Three 200mg  tabs four times a day (every meal & bedtime) d. A  prescription for pain medication (such as oxycodone, hydrocodone, etc) should be given to you upon discharge.  Take your pain medication as prescribed.  i. If you are having problems/concerns with the prescription medicine (does not control pain, nausea, vomiting, rash, itching, etc), please call us [phone] to see if we need to switch you to a different pain medicine that will work better for you and/or control your side effect better. ii. If you need a refill on your pain medication, please contact your pharmacy.  They will contact  our office to request authorization. Prescriptions will not be filled after 5 pm or on week-ends. 4. Avoid getting constipated.  Between the surgery and the pain medications, it is common to experience some constipation.  Increasing fluid intake and taking a fiber supplement (such as Metamucil, Citrucel, FiberCon, MiraLax, etc) 1-2 times a day regularly will usually help prevent this problem from occurring.  A mild laxative (prune juice, Milk of Magnesia, MiraLax, etc) should be taken according to package directions if there are no bowel movements after 48 hours.   5. Watch out for diarrhea.  If you have many loose bowel movements, simplify your diet to bland foods & liquids for a few days.  Stop any stool softeners and decrease your fiber supplement.  Switching to mild anti-diarrheal medications (Kayopectate, Pepto Bismol) can help.  If this worsens or does not improve, please call us. 6. Wash / shower every day.  You may shower over the incision / wound.  Avoid baths until the skin is fully healed.  Continue to shower over incision(s) after the dressing is off. 7. Remove your waterproof bandages 5 days after surgery.  You may leave the incision open to air.  You may replace a dressing/Band-Aid to cover the incision for comfort if you wish. 8. ACTIVITIES as tolerated:   a. You may resume regular (light) daily activities beginning the next day--such as daily self-care, walking, climbing stairs--gradually increasing activities as tolerated.  If you can walk 30 minutes without difficulty, it is safe  to try more intense activity such as jogging, treadmill, bicycling, low-impact aerobics, swimming, etc. b. Save the most intensive and strenuous activity for last such as sit-ups, heavy lifting, contact sports, etc  Refrain from any heavy lifting or straining until you are off narcotics for pain control.   c. DO NOT PUSH THROUGH PAIN.  Let pain be your guide: If it hurts to do something, don't do it.  Pain is your  body warning you to avoid that activity for another week until the pain goes down. d. You may drive when you are no longer taking prescription pain medication, you can comfortably wear a seatbelt, and you can safely maneuver your car and apply brakes. e. Dennis Bast may have sexual intercourse when it is comfortable.  9. FOLLOW UP in our office a. Please call [facility] at [phone] to set up an appointment to see your surgeon in the office for a follow-up appointment approximately 1-2 weeks after your surgery. b. Make sure that you call for this appointment the day you arrive home to insure a convenient appointment time. 10. IF YOU HAVE DISABILITY OR FAMILY LEAVE FORMS, BRING THEM TO THE OFFICE FOR PROCESSING.  DO NOT GIVE THEM TO YOUR DOCTOR.   WHEN TO CALL us [phone]: 1. Poor pain control 2. Reactions / problems with new medications (rash/itching, nausea, etc)  3. Fever over 101.5 F (38.5 C) 4. Inability to urinate 5. Nausea and/or vomiting 6. Worsening swelling or bruising 7. Continued bleeding from incision. 8. Increased pain, redness, or drainage from the incision  The clinic staff is available to answer your questions during regular business hours (8:30am-5pm).  Please dont hesitate to call and ask to speak to one of our nurses for clinical concerns.   A surgeon from [facility] is always on call at the hospitals   If you have a medical emergency, go to the nearest emergency room or call 911.    [facility], [place], [place], [place], [place]  [address] ? MAIN: [phone] ? TOLL FREE: [phone] ? FAX [phone] [email or web address]

## 2013-06-28 NOTE — Discharge Summary (Signed)
Physician Discharge Summary  Patient ID: Jill Manning MRN: [id number] DOB/AGE: [birth year] 58 y.o.  Admit date: 06/25/2013 Discharge date: 06/28/2013  Admission Diagnoses:  Discharge Diagnoses:  Active Problems:   * No active hospital problems. *   Discharged Condition: good  Hospital Course: The patient was admitted after robotic sigmoidectomy and bilateral oophorectomy.  Her foley was removed on POD 1.  Her bowel function returned by the end of POD 1 and her diet was advanced appropriately.  By POD 3 the patient was tolerating a soft diet and PO narcotics.  She was ambulating without difficulty and having bowel movements.  Consults: None  Significant Diagnostic Studies: labs: cbc, chemistries   Treatments: IV hydration, analgesia: acetaminophen w/ codeine and surgery: robotic sigmoidectomy  Discharge Exam: Blood pressure 124/77, pulse 74, temperature 98.5 F (36.9 C), temperature source Oral, resp. rate 18, height 5\' 2"  (1.575 m), weight 138 lb (62.596 kg), SpO2 96.00%. General appearance: alert and cooperative GI: normal findings: soft, non-tender Incision/Wound: clean, dry, intact  Disposition: Home    Medication List         ALPRAZolam 0.25 MG tablet  Commonly known as:  XANAX  Take 0.25 mg by mouth 2 (two) times daily as needed for anxiety.     calcium carbonate 500 MG chewable tablet  Commonly known as:  TUMS - dosed in mg elemental calcium  Chew 1 tablet by mouth daily.     HYDROcodone-acetaminophen 5-325 MG per tablet  Commonly known as:  NORCO/VICODIN  Take 1-2 tablets by mouth every 4 (four) hours as needed for moderate pain or severe pain.     ondansetron 8 MG tablet  Commonly known as:  ZOFRAN  Take by mouth every 8 (eight) hours as needed for nausea or vomiting. Nausea as needed     PREMARIN vaginal cream  Generic drug:  conjugated estrogens  Place 1 Applicatorful vaginally 2 (two) times a week.           Follow-up Information   Follow up  with Rosario Adie., MD. Schedule an appointment as soon as possible for a visit in 2 weeks.   Specialty:  General Surgery   Contact information:   [address] [place] [address] [phone]       Follow up with COUSINS,SHERONETTE A, MD. Schedule an appointment as soon as possible for a visit in 4 weeks.   Specialty:  Obstetrics and Gynecology   Contact information:   [address] [place] [address] [phone]       Signed: Rosario Adie 07/03/7865, 6:72 AM

## 2013-06-28 NOTE — Telephone Encounter (Signed)
Left pt message to call me.  I have scheduled her po appointment for 07/18/2013 at 10 am.

## 2013-06-28 NOTE — Progress Notes (Signed)
Patient dressed and ready for d/c to home. Belongings packed and taken with husband to vehicle. Discharge instructions given to patient. MD removed abd dsg. Low abd incison line approximated as well as dermabonded incisions. Oral pain med given with po zofran tab as requested for tolerance. Excited to go home. Transfer to vehicle via wheelchair per staff member.

## 2013-06-28 NOTE — Telephone Encounter (Signed)
Pt was notified of her appt.

## 2013-06-28 NOTE — Telephone Encounter (Signed)
Message copied by Gweneth Fritter on Fri Jun 28, 2013 12:52 PM ------      Message from: [place], [place]: Fri Jun 28, 2013 10:50 AM      Contact: [phone]       SHE HAD SURGERY ON 06/25/13 AND NEEDS A PO APPT ------

## 2013-07-10 ENCOUNTER — Telehealth (INDEPENDENT_AMBULATORY_CARE_PROVIDER_SITE_OTHER): Payer: Self-pay

## 2013-07-10 ENCOUNTER — Other Ambulatory Visit (INDEPENDENT_AMBULATORY_CARE_PROVIDER_SITE_OTHER): Payer: Self-pay

## 2013-07-10 DIAGNOSIS — G8918 Other acute postprocedural pain: Secondary | ICD-10-CM

## 2013-07-10 MED ORDER — TRAMADOL HCL 50 MG PO TABS: 50.0000 mg | ORAL_TABLET | Freq: Four times a day (QID) | ORAL | Status: AC | PRN

## 2013-07-10 NOTE — Telephone Encounter (Signed)
Pt called to check on status of refill.  It was not confirmed as received at pharmacy, so phoned it in to the answering machine at [facility].  She will check later in the evening for it.

## 2013-07-10 NOTE — Telephone Encounter (Signed)
Pt called requesting pain medication refill. She states she is doing well but still sore. No fever. No nausea . Wounds clean and dry. No redness/ Last BM today. Voiding well. Tolerating normal diet.  Per protocol Tramadol 50 mg #30 sent to pharmacy via epic. Pt will cal with any concerns.

## 2013-07-18 ENCOUNTER — Ambulatory Visit (INDEPENDENT_AMBULATORY_CARE_PROVIDER_SITE_OTHER): Payer: BC Managed Care – PPO | Admitting: General Surgery

## 2013-07-18 ENCOUNTER — Encounter (INDEPENDENT_AMBULATORY_CARE_PROVIDER_SITE_OTHER): Payer: Self-pay | Admitting: General Surgery

## 2013-07-18 VITALS — BP 126/78 | HR 72 | Temp 99.4°F | Resp 14 | Ht 62.0 in | Wt 131.8 lb

## 2013-07-18 DIAGNOSIS — Z9889 Other specified postprocedural states: Secondary | ICD-10-CM

## 2013-07-18 NOTE — Patient Instructions (Signed)
No heavy lifting for a total of 8 weeks after surgery. Okay to increase diet to high fiber at this point.

## 2013-07-18 NOTE — Progress Notes (Signed)
Jill Manning is a 58 y.o. female who is status post a Robotic-assisted sigmoid colectomy and bilateral oophorectomy by Dr. Garwin Brothers on January 20.  She is doing well. She is having regular bowel movements. She denies any abdominal pain. She feels better and better each day. She is off of her narcotics.  Objective: Filed Vitals:   07/18/13 0955  BP: 126/78  Pulse: 72  Temp: 99.4 F (37.4 C)  Resp: 14    General appearance: alert and cooperative GI: normal findings: soft, non-tender  Incision: healing well   Assessment: s/p  There are no active problems to display for this patient.   Plan: [name] Is doing well after surgery. Her incision seemed to be healing well. I've told her that she can return to her high fiber diet. She should continue with colonoscopies per her regular schedule.    Rosario Adie, [facility], [place]   07/18/2013 10:06 AM

## 2013-12-21 IMAGING — CT CT PELVIS W/ CM
3 series · 13 of 36 positions shown, 19 images · IV contrast (READICAT & [ID] OMNI 300)
Comparison: None.

CLINICAL DATA: History of left ovarian cyst

CT PELVIS WITH CONTRAST
TECHNIQUE: Multidetector CT imaging of the pelvis was performed
using the standard protocol following the bolus administration of
intravenous contrast.
Contrast: 100mL OMNIPAQUE IOHEXOL 300 MG/ML  SOLN

[Series 3: routine pelvis · axial · 0.70mm/px · z∈[-273,-143]mm · 5 of 40 slices shown, 10 images]
[im 7/40  soft-tissue]
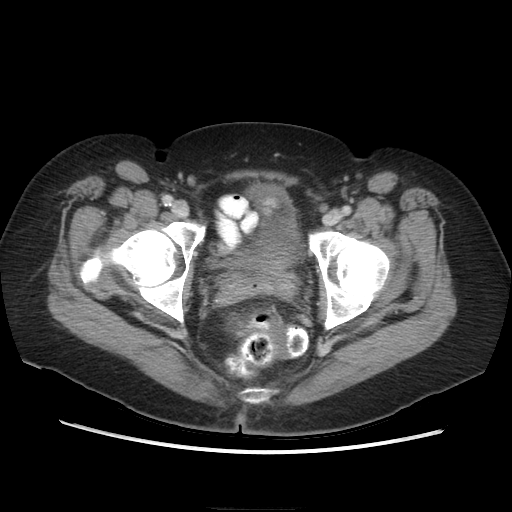
[im 7/40  bone]
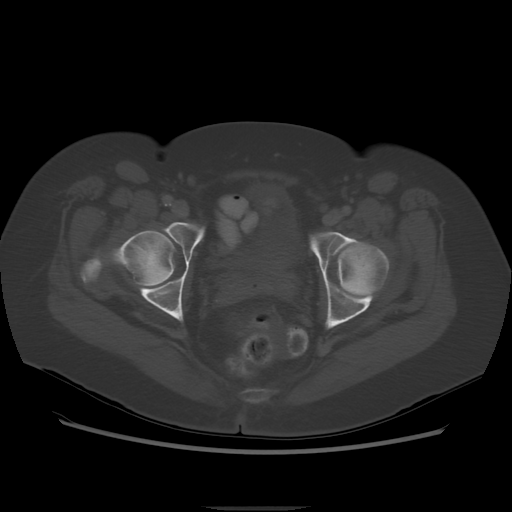
[im 14/40  soft-tissue]
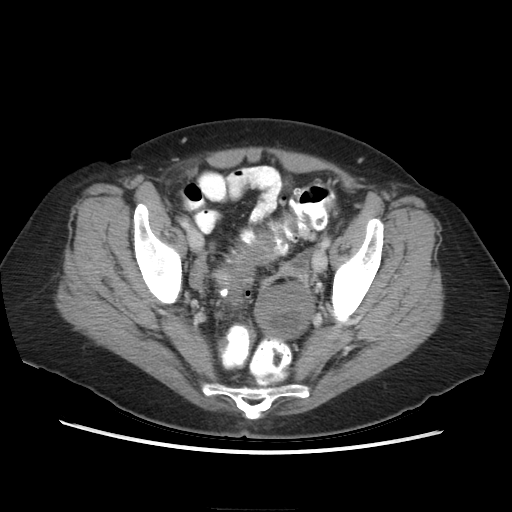
[im 14/40  lung]
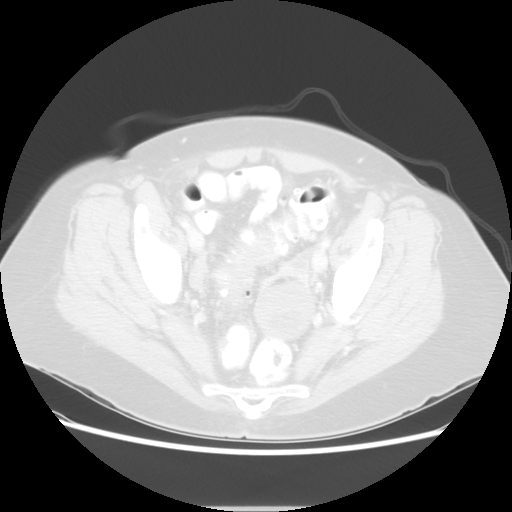
[im 20/40  soft-tissue]
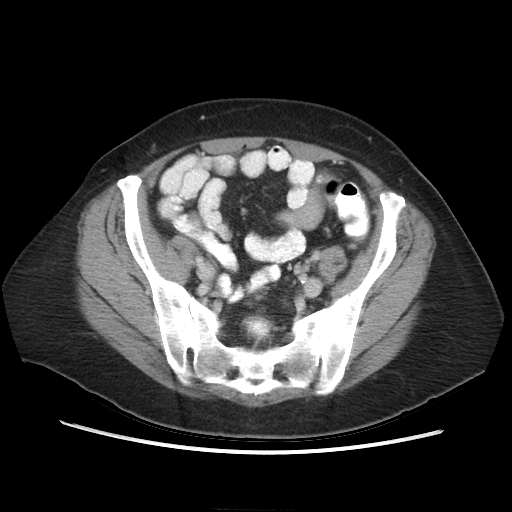
[im 20/40  lung]
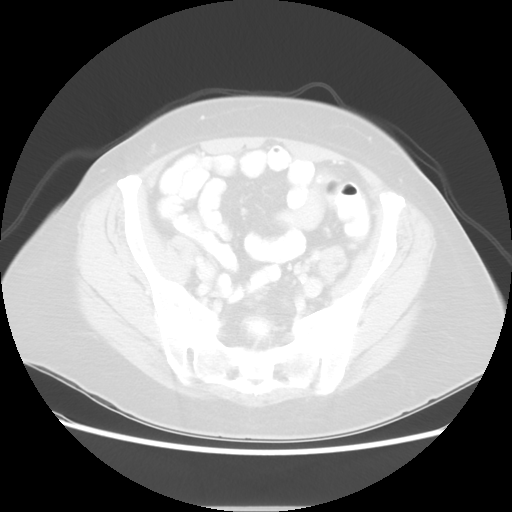
[im 27/40  soft-tissue]
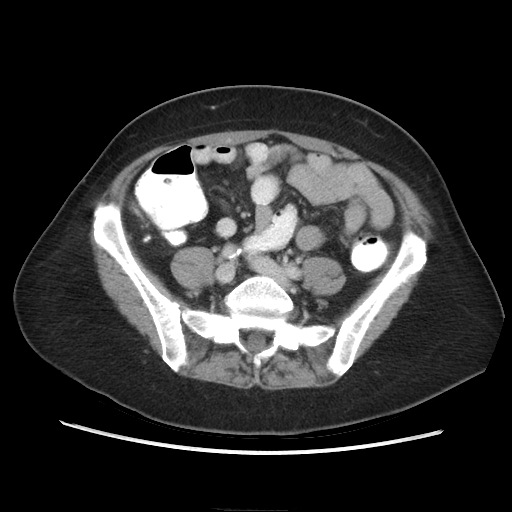
[im 27/40  lung]
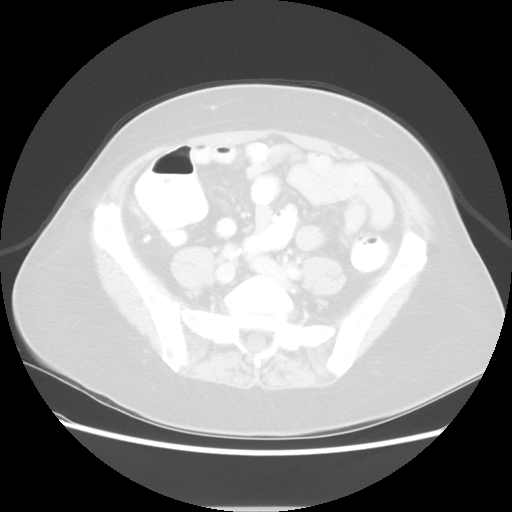
[im 33/40  soft-tissue]
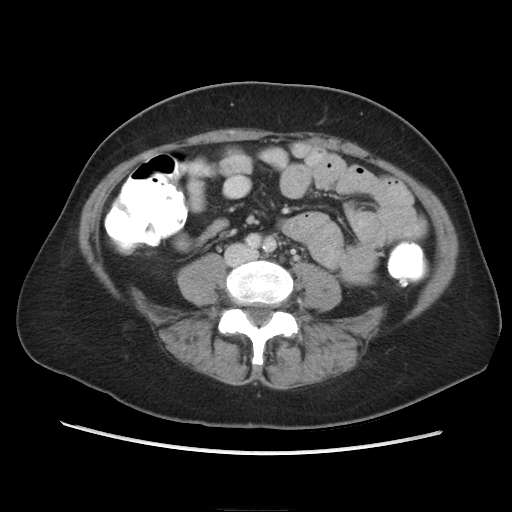
[im 33/40  lung]
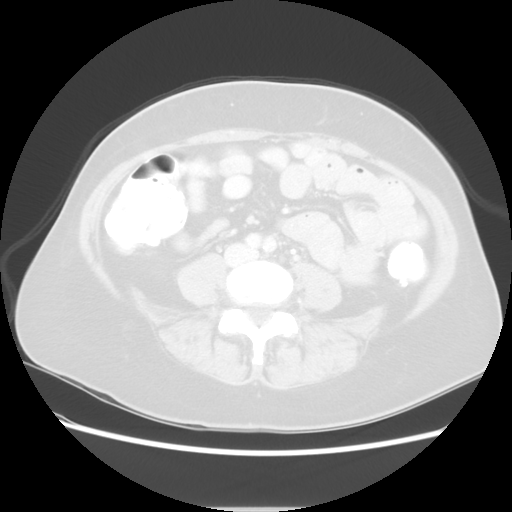

[Series 601: coronal body · coronal · 0.70mm/px · 1 of 107 slices shown, 2 images]
[im 36/107  soft-tissue]
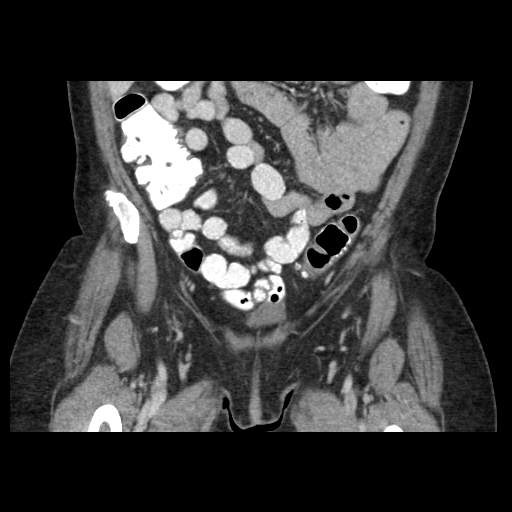
[im 36/107  bone]
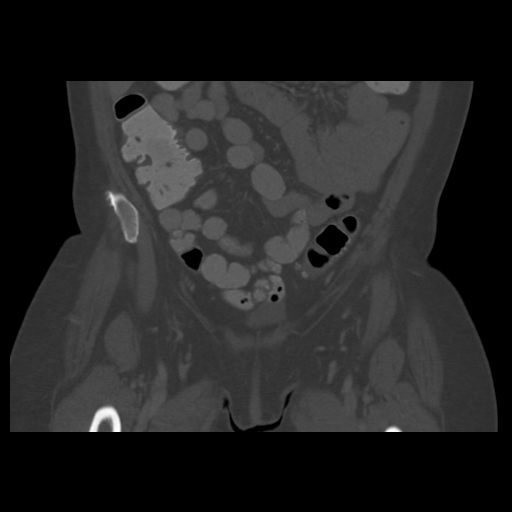

[Series 602: sagittal body · sagittal · 0.70mm/px · 7 of 145 slices shown]
[im 13/145  soft-tissue]
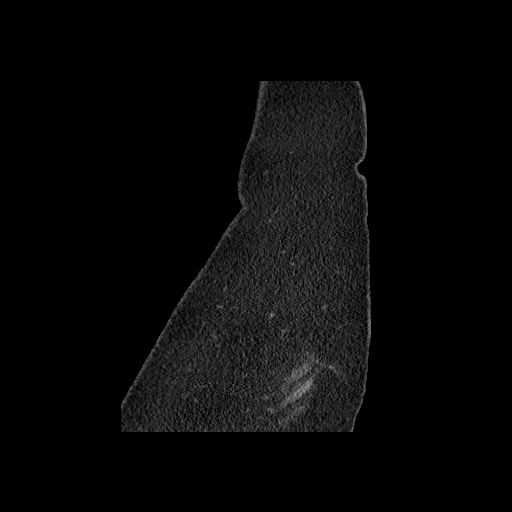
[im 31/145  soft-tissue]
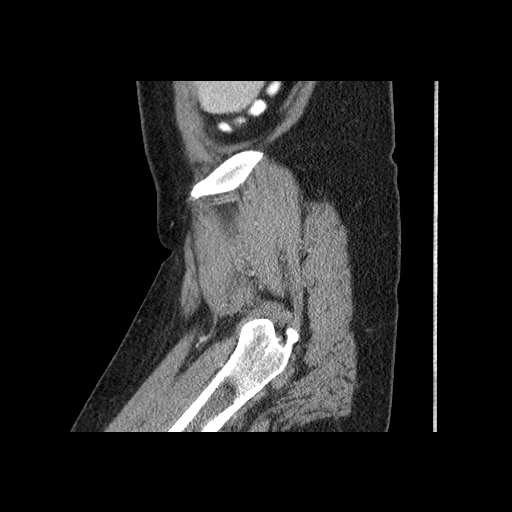
[im 49/145  soft-tissue]
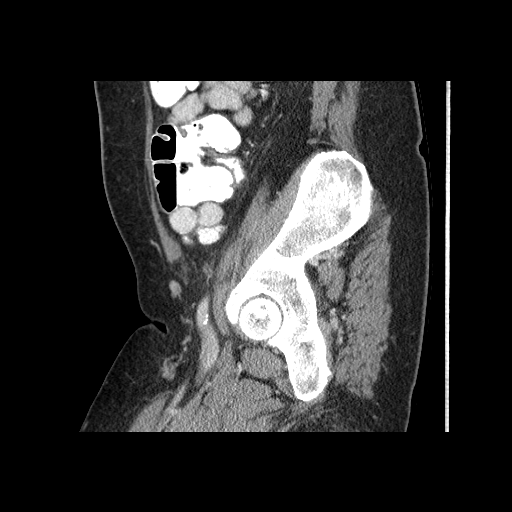
[im 67/145  soft-tissue]
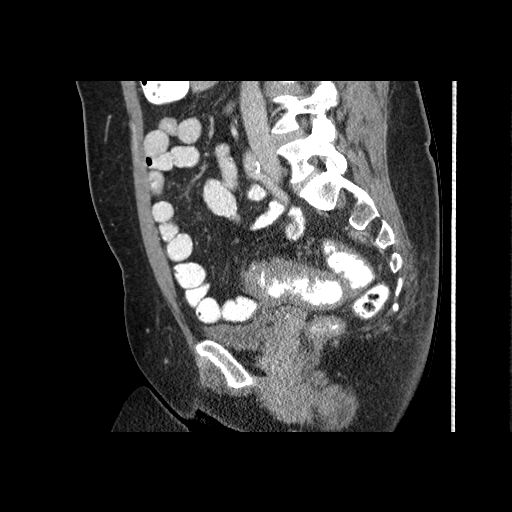
[im 79/145  soft-tissue]
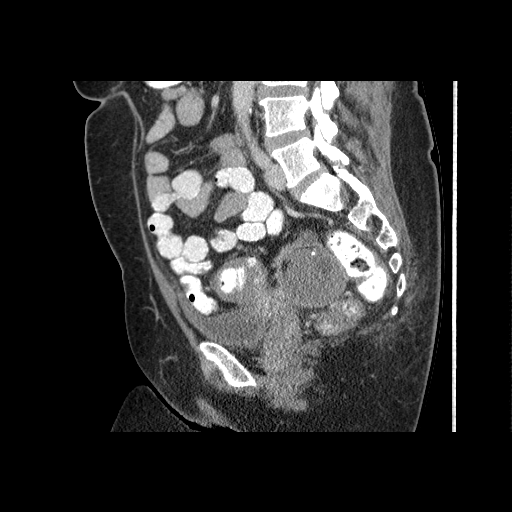
[im 97/145  soft-tissue]
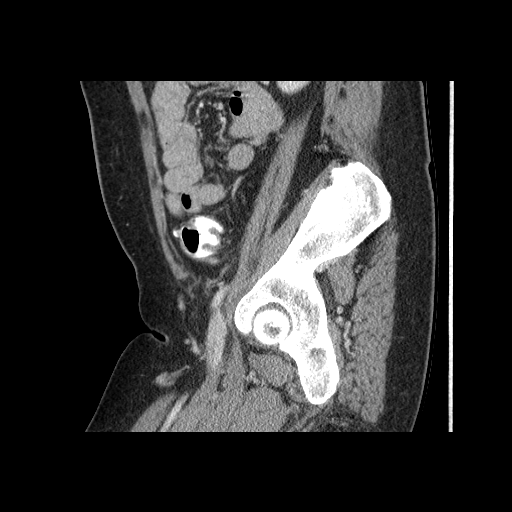
[im 115/145  soft-tissue]
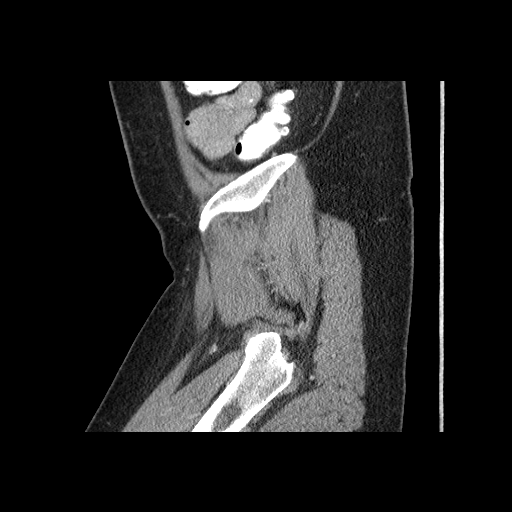

[13 of 36 positions shown; findings below may reference images not displayed]

FINDINGS: There is an oval soft tissue mass within the left adnexa
containing solid and cystic components as well as some fat and
calcification most consistent with a left dermoid cyst.  This
lesion measures 5.2 x 4.3 x 5.8 cm. Immediately adjacent and
anterior to this left adnexal lesion, there is strandiness,
extending to the sigmoid colon where there is marked thickening of
the haustra and possible mild edema.  These findings are most
consistent with diverticulosis and with the strandiness extending
from the sigmoid colon toward the vagina, a fistulous communication
is a definite consideration. Clinical correlation is recommended.
Some air is noted within the vagina.  The uterus has previously
been resected.  Multiple diverticula are present but no definite
diverticulitis is seen.  Diverticula also are noted within the
descending colon.  The appendix and terminal ileum are
unremarkable.  The urinary bladder is decompressed.  No free fluid
is seen within the pelvis and no significant bony abnormality is
seen.
IMPRESSION: 1.  5.2 x 4.3 x 5.8 cm soft tissue mass in the left adnexa
containing fat and calcification most consistent with a left
dermoid cyst.
2.  Changes of diverticulosis with considerable thickening of the
mucosa of the rectosigmoid colon with multiple diverticula.
3.  Some linear strandiness in the region of the sigmoid colon
diverticulosis extending toward the vagina.  Cannot exclude a
fistulous communication.  Correlate clinically.
